# Patient Record
Sex: Female | Born: 1970 | Race: White | Hispanic: No | Marital: Married
Health system: Midwestern US, Community
[De-identification: ages and names within clinical notes are randomized; demographics above are authoritative.]

## PROBLEM LIST (undated history)

## (undated) DIAGNOSIS — M5010 Cervical disc disorder with radiculopathy, unspecified cervical region: Secondary | ICD-10-CM

## (undated) DIAGNOSIS — G894 Chronic pain syndrome: Secondary | ICD-10-CM

---

## 2017-02-23 ENCOUNTER — Ambulatory Visit: Admit: 2017-02-23 | Discharge: 2017-02-23 | Payer: PRIVATE HEALTH INSURANCE | Attending: Anesthesiology

## 2017-02-23 DIAGNOSIS — M5412 Radiculopathy, cervical region: Secondary | ICD-10-CM

## 2017-02-23 NOTE — ACP (Advance Care Planning) (Signed)
No ACP on file for patient. Patient not interested at this time.

## 2017-02-23 NOTE — Progress Notes (Signed)
[facility]  Interventional Pain Management Consultation History & Physical    PATIENT NAME:  Deborah Williams     DATE OF BIRTH:   Aug 11, 1971    DATE OF SERVICE:   02/23/2017      CHIEF COMPLAINT:  Neck Pain and Shoulder Pain      REASON FOR VISIT:   Marnita Poirier Brodhead presents to the pain clinic today for initial evaluation and to consider interventional pain management options as indicated for the type and location of the pain the patient is presenting with.            HISTORY OF PRESENT ILLNESS:     Patient presents for initial evaluation and consideration for interventional procedures as indicated.  She is referred to [place] by Dr. Shelbie Hutching for evaluation and consideration for cervical procedures.     Today's evaluation patient endorses tonic neck and upper extremity radiating symptoms of long-standing duration.  She denies any specific antecedent trauma injury or accident.  She endorses insidious onset to her neck and upper extremity symptoms.  She currently endorses bilateral neck pain radiating upper extremity pain numbness paresthesias pins and needles.  Aching sharp shooting in the neck and bilateral upper extremities.  Her upper extremities feel weak and heavy at times.  Symptoms are increased with turning her head to either side as well as looking up or down.  Symptoms are increased with use of her arms.  She is tried medications with no benefit.  Medications include gabapentin and clonidine.  She has had previous cervical spine surgery, C5- 6 spinal fusion..  She is not take blood thinners.     Review of available medical records, progress note dated February 10, 2017 by Dr. Shelbie Hutching is reviewed.  Recommendation for cervical injections as noted.  Chronic neck pain, bilateral upper extremity symptoms.  Patient is on gabapentin 300 mg twice daily.  Mobic daily clonidine daily Flector patches.  Aching sharp upper extremity radiating quality pain.  Bilateral  upper extremity weakness.  Bilateral neck pain is noted.  Cervical thoracic radiculopathy.  Cervical postlaminectomy syndrome.  Bilateral carpal tunnel syndrome.     Review of the MRI cervical shows multiple previous cervical MRIs.  Last 10/2010.  Fusion C5-6.  Degenerative disc disease with moderate stenosis C4-5 C6-7.  Electromyelogram studies are noted.  Patient's chronic neck and arm pain since 2010, neck fusion 2012.  Constant neck pain that radiates to the arms.  Neck and arm pain are equally bothersome.  Right arm more than left arm.  Patient stopped her gabapentin regimen because of side effects.  She has not had physical therapy or injections since 2014.     Updated cervical MRI January 09, 2017.  Mild disc bulge C4-5 mild to moderate central canal stenosis mild bilateral neuroforaminal narrowing.  Disc protrusion C6-7 causing effacement of ventral cord moderate canal stenosis.  C4-5 focal protrusion mild to moderate canal stenosis bilateral neuroforaminal narrowing.  C5-6 anterior cervical disc fusion.  Shallow protrusion C6-7.          ASSESSMENT/OPTIONS: as follows.    We discussed options.  Patient is presenting with long-standing history chronic neck pain, upper extremity radiating symptoms.  She has had previous cervical spine fusion, anterior cervical disc fusion C5-6.  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  She likely also has an element of cervical facet syndrome.  I have been asked by Dr. Clydene Pugh to consider especially right-sided cervical facet injections, right side C4-5, C5-6.  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  She requested to be unconscious for her procedure.  I told her I cannot facilitate this, however I can provide moderate sedation that will almost assuredly provide her with the sedation that she is requesting.  In light  of her needle phobia, as well as her signs and symptoms, imaging evidence suggestive of cervical radiculitis versus cervical radiculopathy in addition to cervical facet syndrome, I will start with cervical epidural steroid injections with IV conscious sedation.  I believe she will tolerate these better, it would be 1 interventional needlestick instead of 2-3 or 4 if we were to do both sides of cervical facets being requested.  If patient is still having symptomatology of her cervical facets, I will address these at subsequent interventional procedure visits.  I have discussed the risks and benefits, indications, contraindications, and side effects of intended procedure with the patient.  I have used skeleton spine model to describe and discuss the procedure with the patient.  I have answered all questions relating to the procedure.  Patient understands the nature of the procedure and wishes to proceed.  Patient has no further questions.               MRI Results (most recent):  No results found for this or any previous visit.        PAST MEDICAL HISTORY:   The patient  has no past medical history on file.    PAST SURGICAL HISTORY:   The patient  has no past surgical history on file.    CURRENT MEDICATIONS:   The patient has a current medication list which includes the following prescription(s): meloxicam, gabapentin, clonidine hcl, and estradiol.    ALLERGIES:     Allergies   Allergen Reactions   ??? Daypro [Oxaprozin] Hives       FAMILY HISTORY:   The patient family history is not on file.    SOCIAL HISTORY:   The patient  reports that she has quit smoking. She does not have any smokeless tobacco history on file. The patient  has no alcohol history on file. She      REVIEW OF SYSTEMS:    The patient denies fever, chills, weight loss (Constitutional), rash, itching (Skin), tinnitus, congestion (HENT), blurred vision, photophobia (Eyes), palpitations, orthopnea  (Cardiovascular), hemoptysis, wheezing (Respiratory), nausea, vomiting, diarrhea (Gastrointestinal), dysuria, hematuria, urgency (Genitourinary), bowel or bladder incontinence, loss of consciousness (Neurologic), suicidal or homicidal ideation or hallucinations (Psychiatric). Denies swelling, axillary or groin masses (Lymphatic).           PHYSICAL EXAM:  VS:   Visit Vitals   ??? BP 120/55 (BP 1 Location: Left arm, BP Patient Position: Sitting)   ??? Pulse 77   ??? Temp 98.2 ??F (36.8 ??C) (Oral)   ??? Resp 16   ??? Ht 5' 1.5" (1.562 m)   ??? Wt 86.2 kg (190 lb)   ??? LMP 02/18/2017   ??? BMI 35.32 kg/m2     General: Well-developed and well-nourished. Body habitus consistent with recorded height and weight and the calculated BMI. Apparent distress due to neck and upper extremity symptoms.   Head: Normocephalic, atraumatic.  Skin: Inspection of the skin reveals no rashes, lesions or infection.  CV: Regular rate. No murmurs or rubs noted. No peripheral edema noted.  Pulm: Respirations are even and unlabored.  Extr: No clubbing, cyanosis, or edema noted.  Musculoskeletal:  1. Cervical spine ???decreased range of motion all axes .  Paraspinous tenderness right greater than left paraspinal and facet areas.   There is no scoliosis, asymmetry, or musculoskeletal defect.  2. Thoracic spine ??? Full ROM.  No paraspinous tenderness at any level.  There is no scoliosis, asymmetry, or musculoskeletal defect.  3. Lumbar spine ???decreased ROM.  No paraspinous tenderness at any level.  SI joints are nontender bilaterally. There is no scoliosis, asymmetry, or musculoskeletal defect.  4. Right upper extremity ??? Full ROM.  5/5 muscle strength in all muscle groups. No pain or tenderness in shoulder, elbow, wrist, or hand.  5. Left upper extremity ??? Full ROM.  5/5 muscle strength in all muscle groups.  No pain or tenderness in shoulder, elbow, wrist, or hand.  6. Right lower extremity ??? Full ROM.  5/5 muscle strength in all muscle  groups. No pain, tenderness, or swelling in the hip, knee, ankle or foot.    7. Left lower extremity ??? Full ROM.  5/5 muscle strength in all muscle groups.  No pain, tenderness, or swelling in the hip, knee, ankle or foot.    Neurological:  1. Mental Status - Alert, awake and oriented. Speech is clear and appropriate.  2. Cranial Nerves - Extraocular muscles intact bilaterally. Cranial nerves II-XII grossly intact bilaterally.  3. Gait - Non-antalgic   4. Reflexes - 2+ and symmetric throughout.  5. Sensation - Intact to light touch and pin prick.   6. Provocative Tests - Spurling???s negative bilaterally. Straight leg raise negative bilaterally.   Psychological:  1. Mood and affect ??? Appropriate.  2. Speech ??? Appropriate.  3. Though content ??? Appropriate.  4. Judgment ??? Appropriate.    ASSESSMENT:      ICD-10-CM ICD-9-CM    1. Cervical radiculopathy M54.12 723.4    2. Cervical facet syndrome (HCC) M46.92 723.8    3. Degenerative disc disease, cervical M50.30 722.4    4. Chronic pain syndrome G89.4 338.4            PLAN:    1.    I have thoroughly discussed the risks and benefits, indications, contraindications, and side effects of any and procedures that were mentioned at today's patient visit. I have used a skeleton model to explain all procedures, as well as to provide added emphasis regarding procedures and as well for patient education purposes.       I have answered all questions in great detail, and I have obtained verbal confirmation for all procedures planned with the patient.   3.    I have reviewed in great detail today, when indicated, the patient's MRI and other imaging studies with the patient. I have explained to the patient, when indicated, their condition using both actual recent and relevant images insofar as I am able to obtain actual images. I have used a skeleton model for added emphasis as well as patient education.      4.    I have advised patient to have a primary care provider continue to  care for their health maintenance and general medical conditions.   5,    I have placed appropriate referrals to specialty care providers as I have deemed necessary through today's clinical consultation with the patient.  5.    I have explained to the patient that if any significant side effects, issues, problems, concerns, or perceived complications may arise at around the time of the  patient's procedures, they should either call the pain management clinic or go to the emergency room immediately for medical provider evaluation.   6.   I have encouraged all patients to call the pain management clinic with any questions or concerns that they may have pertaining to their procedures.     DISPOSITION:   The patient???s condition and plan were discussed at length and all questions were answered.  The patient agrees with the plan.    A total of 45 minutes was spent with the patient of which over half of the time was spent counseling the patient.     Allayne GitelmanJames H Bernadett Milian, MD 02/23/2017 9:23 PM    Note: Although these clinic notes were documented by the provider at the time of the exam, they have not been proofed and are subject to transcription variance.

## 2017-02-23 NOTE — Progress Notes (Signed)
The pt is here today to discuss procedure options with Dr. Henick. Pt rates pain as 4/10.

## 2017-02-23 NOTE — H&P (View-Only) (Signed)
The pt is here today to discuss procedure options with Dr. Henick. Pt rates pain as 4/10.

## 2017-03-15 NOTE — Telephone Encounter (Signed)
Called to stop ibuprofen 4 days  prior to procedure on 03/20/1917.

## 2017-03-20 ENCOUNTER — Inpatient Hospital Stay: Payer: PRIVATE HEALTH INSURANCE

## 2017-03-20 ENCOUNTER — Ambulatory Visit: Admit: 2017-03-20 | Payer: PRIVATE HEALTH INSURANCE

## 2017-03-20 LAB — HCG URINE, QL. - POC: Pregnancy test,urine (POC): NEGATIVE

## 2017-03-20 MED ORDER — DEXAMETHASONE SODIUM PHOSPHATE (PF) 10 MG/ML INJECTION
10 mg/mL | INTRAMUSCULAR | Status: AC
Start: 2017-03-20 — End: ?

## 2017-03-20 MED ORDER — IOPAMIDOL 41 % INTRATHECAL
200 mg iodine /mL (41 %) | INTRATHECAL | Status: DC | PRN
Start: 2017-03-20 — End: 2017-03-20
  Administered 2017-03-20: 18:00:00

## 2017-03-20 MED ORDER — MIDAZOLAM 1 MG/ML IJ SOLN
1 mg/mL | INTRAMUSCULAR | Status: AC
Start: 2017-03-20 — End: ?

## 2017-03-20 MED ORDER — SODIUM CHLORIDE 0.9 % IJ SYRG
INTRAMUSCULAR | Status: DC | PRN
Start: 2017-03-20 — End: 2017-03-20
  Administered 2017-03-20: 18:00:00 via INTRAVENOUS

## 2017-03-20 MED ORDER — LIDOCAINE (PF) 10 MG/ML (1 %) IJ SOLN
10 mg/mL (1 %) | INTRAMUSCULAR | Status: AC
Start: 2017-03-20 — End: ?

## 2017-03-20 MED ORDER — FENTANYL CITRATE (PF) 50 MCG/ML IJ SOLN
50 mcg/mL | INTRAMUSCULAR | Status: AC
Start: 2017-03-20 — End: ?

## 2017-03-20 MED ORDER — FENTANYL CITRATE (PF) 50 MCG/ML IJ SOLN
50 mcg/mL | INTRAMUSCULAR | Status: DC | PRN
Start: 2017-03-20 — End: 2017-03-20
  Administered 2017-03-20: 18:00:00 via INTRAVENOUS

## 2017-03-20 MED ORDER — DEXAMETHASONE SODIUM PHOSPHATE 10 MG/ML IJ SOLN
10 mg/mL | INTRAMUSCULAR | Status: DC | PRN
Start: 2017-03-20 — End: 2017-03-20
  Administered 2017-03-20: 18:00:00

## 2017-03-20 MED ORDER — MIDAZOLAM 1 MG/ML IJ SOLN
1 mg/mL | INTRAMUSCULAR | Status: DC | PRN
Start: 2017-03-20 — End: 2017-03-20
  Administered 2017-03-20: 18:00:00 via INTRAVENOUS

## 2017-03-20 MED ORDER — LIDOCAINE (PF) 10 MG/ML (1 %) IJ SOLN
10 mg/mL (1 %) | INTRAMUSCULAR | Status: DC | PRN
Start: 2017-03-20 — End: 2017-03-20
  Administered 2017-03-20: 18:00:00

## 2017-03-20 MED ORDER — IOPAMIDOL 41 % INTRATHECAL
200 mg iodine /mL (41 %) | INTRATHECAL | Status: AC
Start: 2017-03-20 — End: ?

## 2017-03-20 MED FILL — BD POSIFLUSH NORMAL SALINE 0.9 % INJECTION SYRINGE: INTRAMUSCULAR | Qty: 10

## 2017-03-20 MED FILL — LIDOCAINE (PF) 10 MG/ML (1 %) IJ SOLN: 10 mg/mL (1 %) | INTRAMUSCULAR | Qty: 30

## 2017-03-20 MED FILL — DEXAMETHASONE SODIUM PHOSPHATE (PF) 10 MG/ML INJECTION: 10 mg/mL | INTRAMUSCULAR | Qty: 2

## 2017-03-20 MED FILL — ISOVUE-M 200  41 % INTRATHECAL SOLUTION: 200 mg iodine /mL (41 %) | INTRATHECAL | Qty: 10

## 2017-03-20 MED FILL — MIDAZOLAM 1 MG/ML IJ SOLN: 1 mg/mL | INTRAMUSCULAR | Qty: 4

## 2017-03-20 MED FILL — FENTANYL CITRATE (PF) 50 MCG/ML IJ SOLN: 50 mcg/mL | INTRAMUSCULAR | Qty: 4

## 2017-03-20 NOTE — Procedures (Signed)
[facility]    INTERLAMINAR CERVICAL EPIDURAL STEROID INJECTION  PROCEDURE REPORT      PATIENT:  Deborah Williams  DATE OF BIRTH:  04-Oct-1971  DATE OF SERVICE:  03/20/2017  SITE:  [facility] Special [facility] Suite    PRE-PROCEDURE DIAGNOSIS:  See Above    POST-PROCEDURE DIAGNOSIS:  See Above                PROCEDURE:    1. Interlaminar cervical epidural steroid injection, C7-T1  (16109)  2. Fluoroscopic needle guidance (spinal) (60454)  3. Supervision of moderate sedation [phone])    ANESTHESIA:  Local with moderate IV sedation. See Medication Administration Record for specific medications and dosage.    COMPLICATIONS: None.    PHYSICIAN:  Seth Bake, MD    PRE-PROCEDURE NOTE:  Pre-procedural assessment of the patient was performed including a limited history and physical examination.  The details of the procedure were discussed with the patient, including the risks, benefits and alternative options and an informed consent was obtained. The patient???s NPO status, if necessary for the specific procedure and/or administration of moderate intravenous sedation, if utilized, and availability of a responsible adult to escort the patient following the procedure were confirmed.    A peripheral intravenous cannula was placed without difficulty and lactated Ringer???s solution administered. See nursing notes for details.    PROCEDURE NOTE:  The patient was brought to the procedure suite and positioned on the fluoroscopy table in the prone position. Physiologic monitors were applied and supplemental oxygen was administered via nasal cannula. The skin was prepped in the standard surgical fashion and sterile drapes were applied over the procedure site. Please refer to the Flowsheet for documentation of the patient???s vital signs and the Medication Administration Record for any oral and/or intravenous sedation administered prior to or during the  procedure.    The above-listed interlaminar space was identified and the skin and subcutaneous tissues were infiltrated with 1% Lidocaine.  Under anterior-posterior fluoroscopic guidance an 18g, 3.5-inch Tuohy epidural needle was advanced along the previously identified interlaminar space.  The fluoroscope was then turned lateral view and a loss of resistance syringe was attached to the needle containing preservative free normal saline. Under lateral flouroscopic guidance, the needle was then advanced through the ligamentum flavum, entering the epidural space with a clear and crisp loss of resistance. The needle was not advanced beyond the interlaminar line at any time during this process. No CSF was noted. Aspiration was negative for blood or CSF.  Additional confirmation was made with the injection of 0.5 mL of a nonionic water-soluble radiographic contrast medium (Isovue-M 200) demonstrating appropriate epidural spread and the absence of vascular uptake.       Following this, a 3 mL solution comprised of 2 mL of dexamethasone (10mg /ml) and 1 mL of lidocaine 1% preservative free was injected slowly after negative aspiration. The needle was cleared of steroid solution and removed. The area was thoroughly cleaned and sterile bandages applied as necessary. The patient tolerated the procedure well and vital signs remained stable throughout the procedure.    POST-PROCEDURE COURSE:  The patient was escorted from the procedure suite in satisfactory condition and recovered per facility protocol based on the type of procedure performed and/or the sedation utilized. The patient did not experience any adverse events and remained hemodynamically stable during the post-procedure period.    DISCHARGE NOTE:  Upon discharge, the patient was able to  tolerate fluids and was in no acute distress.  The patient was oriented to person, place and time and vital signs were stable. Appropriate post-procedure instructions were provided and  explained to the patient in detail and all questions were answered.    Seth BakeJames H Torell Minder, MD 03/20/2017 2:31 PM

## 2017-03-20 NOTE — Procedures (Signed)
[facility]    INTERLAMINAR CERVICAL EPIDURAL STEROID INJECTION  PROCEDURE REPORT      PATIENT:  Deborah Williams  DATE OF BIRTH:  [birth year]  DATE OF SERVICE:  03/20/2017  SITE:  [facility] Special [facility]    PRE-PROCEDURE DIAGNOSIS:  See Above    POST-PROCEDURE DIAGNOSIS:  See Above                PROCEDURE:    1. Interlaminar cervical epidural steroid injection, C7-T1  (16109)  2. Fluoroscopic needle guidance (spinal) (60454)  3. Supervision of moderate sedation [phone])    ANESTHESIA:  Local with moderate IV sedation. See Medication Administration Record for specific medications and dosage.    COMPLICATIONS: None.    PHYSICIAN:  Seth Bake, MD    PRE-PROCEDURE NOTE:  Pre-procedural assessment of the patient was performed including a limited history and physical examination.  The details of the procedure were discussed with the patient, including the risks, benefits and alternative options and an informed consent was obtained. The patient???s NPO status, if necessary for the specific procedure and/or administration of moderate intravenous sedation, if utilized, and availability of a responsible adult to escort the patient following the procedure were confirmed.    A peripheral intravenous cannula was placed without difficulty and lactated Ringer???s solution administered. See nursing notes for details.    PROCEDURE NOTE:  The patient was brought to the procedure suite and positioned on the fluoroscopy table in the prone position. Physiologic monitors were applied and supplemental oxygen was administered via nasal cannula. The skin was prepped in the standard surgical fashion and sterile drapes were applied over the procedure site. Please refer to the Flowsheet for documentation of the patient???s vital signs and the Medication Administration Record for any oral and/or intravenous sedation  administered prior to or during the procedure.    The above-listed interlaminar space was identified and the skin and subcutaneous tissues were infiltrated with 1% Lidocaine.  Under anterior-posterior fluoroscopic guidance an 18g, 3.5-inch Tuohy epidural needle was advanced along the previously identified interlaminar space.  The fluoroscope was then turned lateral view and a loss of resistance syringe was attached to the needle containing preservative free normal saline. Under lateral flouroscopic guidance, the needle was then advanced through the ligamentum flavum, entering the epidural space with a clear and crisp loss of resistance. The needle was not advanced beyond the interlaminar line at any time during this process. No CSF was noted. Aspiration was negative for blood or CSF.  Additional confirmation was made with the injection of 0.5 mL of a nonionic water-soluble radiographic contrast medium (Isovue-M 200) demonstrating appropriate epidural spread and the absence of vascular uptake.       Following this, a 3 mL solution comprised of 2 mL of dexamethasone (10mg /ml) and 1 mL of lidocaine 1% preservative free was injected slowly after negative aspiration. The needle was cleared of steroid solution and removed. The area was thoroughly cleaned and sterile bandages applied as necessary. The patient tolerated the procedure well and vital signs remained stable throughout the procedure.    POST-PROCEDURE COURSE:  The patient was escorted from the procedure suite in satisfactory condition and recovered per facility protocol based on the type of procedure performed and/or the sedation utilized. The patient did not experience any adverse events and remained hemodynamically stable during the post-procedure period.    DISCHARGE NOTE:  Upon discharge, the patient was able to  tolerate fluids and was in no acute distress.  The patient was oriented to person, place  and time and vital signs were stable. Appropriate post-procedure instructions were provided and explained to the patient in detail and all questions were answered.    Seth BakeJames H Oziah Vitanza, MD 03/20/2017 2:31 PM

## 2017-03-20 NOTE — Other (Signed)
Patient armband removed and shredded. Patient ambulated off unit without any signs of distress.

## 2017-03-20 NOTE — Interval H&P Note (Signed)
H&P Update:  Deborah EllisBeth B Williams was seen and examined.  History and physical has been reviewed. The patient has been examined. There have been no significant clinical changes since the completion of the originally dated History and Physical.    [facility]  Brief Pre-Procedure History & Physical    PATIENT NAME:  Deborah Williams   DATE OF BIRTH:  09-22-1971  DATE OF SERVICE:  03/20/2017      CHIEF COMPLAINT:  Pain    HISTORY OF PRESENT ILLNESS:  Deborah Williams presents today for a previously diagnosed problem contributing to some or all of this patient???s pain. The location and pattern of the pain has not changed substantially since the last visit in our office. No other significant medical changes have occurred in the last 30 days.    PAST MEDICAL HISTORY:  The patient  has no past medical history on file.    PAST SURGICAL HISTORY:  The patient  has a past surgical history that includes hx tonsillectomy; hx dilation and curettage; hx cholecystectomy; and hx cervical fusion.    CURRENT MEDICATIONS:  See Medication Administration Record [facility](MAR) in the patient's electronic record.    ALLERGIES:    Allergies   Allergen Reactions   ??? Daypro [Oxaprozin] Hives       FAMILY HISTORY:  The patient family history is not on file.    SOCIAL HISTORY:  The patient  reports that she has quit smoking. She does not have any smokeless tobacco history on file. The patient  reports that she does not drink alcohol. She  reports that she does not use illicit drugs.     REVIEW OF SYSTEMS:  Deborah Williams denies any fever, chills, unexplained weight loss, use of antibiotics for recent infection or bleeding abnormalities.    PHYSICAL EXAM:  VS:   Visit Vitals   ??? BP 127/82 (BP 1 Location: Left arm, BP Patient Position: Sitting)   ??? Pulse 78   ??? Temp 98.4 ??F (36.9 ??C)   ??? Resp 16   ??? Ht 5' 1.5" (1.562 m)   ??? Wt 86.2 kg (190 lb)   ??? LMP 02/18/2017   ??? SpO2 98%   ??? BMI 35.32 kg/m2      Gen: Well-developed. Body habitus consistent with recorded height and weight and the calculated BMI. No apparent distress.  Head: Normocephalic, atraumatic.  Skin: No obvious rashes, lesions or infection.  Pulm: Respirations are even and unlabored.   Psych:    Mood, affect and speech ??? Appropriate.    ASSESSMENT:   1. Stable for cervical ESI interventional pain procedure as discussed.      PLAN:  Proceed with scheduled procedure.     Seth BakeJames H Janera Peugh, MD 03/20/2017 2:07 PM        Signed By: Seth BakeJames H Curtiss Mahmood, MD     March 20, 2017 2:07 PM

## 2017-03-20 NOTE — Addendum Note (Signed)
Addended by: Madolin Twaddle H on: 03/20/2017 10:54 AM      Modules accepted: Orders

## 2017-03-27 NOTE — Telephone Encounter (Signed)
Ms. Deborah Williams was contacted for follow-up status post Interlaminar cervical epidural steroid injection, C7-T1 on March 20, 2017. She reports:    Pre-procedure numerical pain score: 7/10  Post-procedure numerical pain score immediately after: 5/10  Duration of relief post-procedure (if applicable): 1 weeks  Improvement in functional activities (if applicable): Yes  Percentage of overall improvement: 30%    COMMENTS:

## 2017-03-29 NOTE — Telephone Encounter (Signed)
Called [phone] left message to stop ibuprofen 4 days prior to procedure on 04/03/17.

## 2017-03-30 ENCOUNTER — Ambulatory Visit: Admit: 2017-03-30 | Discharge: 2017-03-30 | Payer: PRIVATE HEALTH INSURANCE | Attending: Anesthesiology

## 2017-03-30 DIAGNOSIS — M5412 Radiculopathy, cervical region: Secondary | ICD-10-CM

## 2017-03-30 NOTE — H&P (View-Only) (Signed)
The pt is here today to discuss procedure options with Dr. Henick. Pt rates pain as 6/10.

## 2017-03-30 NOTE — Addendum Note (Signed)
Addended by: Seth BakeHENICK, Suleiman Finigan H on: 03/30/2017 10:14 PM      Modules accepted: Orders

## 2017-03-30 NOTE — Progress Notes (Signed)
The pt is here today to discuss procedure options with Dr. Henick. Pt rates pain as 6/10.

## 2017-03-30 NOTE — Progress Notes (Signed)
[facility]  Interventional Pain Management Consultation History & Physical    PATIENT NAME:  Deborah Williams     DATE OF BIRTH:   12-03-1970    DATE OF SERVICE:   03/30/2017      CHIEF COMPLAINT:  Neck Pain (right side down to shoulder)      REASON FOR VISIT:   Deborah Williams presents to the pain clinic today for follow on evaluation and to consider interventional pain management options as indicated for the type and location of the pain the patient is presenting with.            HISTORY OF PRESENT ILLNESS:    This is a reevaluation and consideration for further interventional procedures as indicated for     I initially saw this very pleasant lady Feb 23, 2017.  This was at the request of Dr. Shelbie Hutching for evaluation of cervical procedures.  I found patient to have signs and symptoms, as well as exam and imaging evidence suggestive of cervical radiculitis versus cervical radiculopathy secondary to cervical degenerative changes.  She has undergone one cervical epidural steroid injection with IV conscious sedation so far.  This is done on March 20, 2017.  She has had some benefit from her neck and upper extremity, bilateral, radiating symptoms.  She wishes to have another procedure     We reviewed her cervical MRI from several weeks ago.  This is significant for anterior cervical changes including anterior cervical disc fusion which appears to me to be C6-7 level.  She appears to have anterior disc protrusion at the C7-T1 level, indenting the ventral aspect of the cord.  I have shown this to her on the image disc that she is brought in.      ASSESSMENT/OPTIONS: as follows.     We discussed options.  Patient continues to have cervical radiculitis versus cervical radiculopathy secondary to cervical degenerative changes.  We can pursue 1 or 2 more cervical epidural steroid injections with IV conscious sedation.  She is  still having pain, I would likely refer her back to neurosurgeon or orthopedic spine surgeon for reevaluation.  If she is deemed not to be a surgical candidate for reoperation, I would discuss with the patient possibility of spinal cord stimulator trial placement if she would like to pursue this.  She may also get some benefit from cervical radiofrequency neurotomy procedures, likely below the level of her previous fusion, which again appears to me to be C6-7 level.               MRI Results (most recent):  No results found for this or any previous visit.        PAST MEDICAL HISTORY:   The patient  has no past medical history on file.    PAST SURGICAL HISTORY:   The patient  has a past surgical history that includes hx tonsillectomy; hx dilation and curettage; hx cholecystectomy; and hx cervical fusion.    CURRENT MEDICATIONS:   The patient has a current medication list which includes the following prescription(s): meloxicam, gabapentin, clonidine hcl, and estradiol.    ALLERGIES:     Allergies   Allergen Reactions   ??? Daypro [Oxaprozin] Hives       FAMILY HISTORY:   The patient family history is not on file.    SOCIAL HISTORY:   The patient  reports that she has quit smoking. She does not have any smokeless tobacco history  on file. The patient  reports that she does not drink alcohol. She      REVIEW OF SYSTEMS:    The patient denies fever, chills, weight loss (Constitutional), rash, itching (Skin), tinnitus, congestion (HENT), blurred vision, photophobia (Eyes), palpitations, orthopnea (Cardiovascular), hemoptysis, wheezing (Respiratory), nausea, vomiting, diarrhea (Gastrointestinal), dysuria, hematuria, urgency (Genitourinary), bowel or bladder incontinence, loss of consciousness (Neurologic), suicidal or homicidal ideation or hallucinations (Psychiatric). Denies swelling, axillary or groin masses (Lymphatic).           PHYSICAL EXAM:  VS:   Visit Vitals    ??? BP 111/51 (BP 1 Location: Left arm, BP Patient Position: Sitting)   ??? Pulse 76   ??? Temp 98.3 ??F (36.8 ??C) (Oral)   ??? Resp 14   ??? Ht 5' 1.5" (1.562 m)   ??? Wt 86.2 kg (190 lb)   ??? LMP 03/19/2017   ??? BMI 35.32 kg/m2     General: Well-developed and well-nourished. Body habitus consistent with recorded height and weight and the calculated BMI. Apparent distress due to neck and upper extremity symptoms.   Head: Normocephalic, atraumatic.  Skin: Inspection of the skin reveals no rashes, lesions or infection.  CV: Regular rate. No murmurs or rubs noted. No peripheral edema noted.  Pulm: Respirations are even and unlabored.  Extr: No clubbing, cyanosis, or edema noted.  Musculoskeletal:  1. Cervical spine ???decreased range of motion bilateral .  Paraspinous tenderness.    There is no scoliosis, asymmetry, or musculoskeletal defect.  2. Thoracic spine ??? Full ROM.  No paraspinous tenderness at any level.  There is no scoliosis, asymmetry, or musculoskeletal defect.  3. Lumbar spine ??? Full ROM.  No paraspinous tenderness at any level.  SI joints are nontender bilaterally. There is no scoliosis, asymmetry, or musculoskeletal defect.  4. Right upper extremity ??? Full ROM.  5/5 muscle strength in all muscle groups. No pain or tenderness in shoulder, elbow, wrist, or hand.  5. Left upper extremity ??? Full ROM.  5/5 muscle strength in all muscle groups.  No pain or tenderness in shoulder, elbow, wrist, or hand.  6. Right lower extremity ??? Full ROM.  5/5 muscle strength in all muscle groups. No pain, tenderness, or swelling in the hip, knee, ankle or foot.    7. Left lower extremity ??? Full ROM.  5/5 muscle strength in all muscle groups.  No pain, tenderness, or swelling in the hip, knee, ankle or foot.    Neurological:  1. Mental Status - Alert, awake and oriented. Speech is clear and appropriate.  2. Cranial Nerves - Extraocular muscles intact bilaterally. Cranial nerves II-XII grossly intact bilaterally.  3. Gait - Non-antalgic    4. Reflexes - 2+ and symmetric throughout.  5. Sensation - Intact to light touch and pin prick.   6. Provocative Tests - Spurling???s negative bilaterally. Straight leg raise negative bilaterally.   Psychological:  1. Mood and affect ??? Appropriate.  2. Speech ??? Appropriate.  3. Though content ??? Appropriate.  4. Judgment ??? Appropriate.    ASSESSMENT:      ICD-10-CM ICD-9-CM    1. Cervical radiculopathy M54.12 723.4    2. Degenerative disc disease, cervical M50.30 722.4    3. Cervical facet syndrome (HCC) M46.92 723.8    4. Chronic pain syndrome G89.4 338.4            PLAN:    1.    I have thoroughly discussed the risks and benefits, indications, contraindications, and side effects of  any/all procedures that were mentioned at today's patient visit. I have used a skeleton spine model when indicated to explain all procedures, as well as to provide added emphasis regarding procedures and as well for patient education purposes.       I have answered all questions in great detail, and I have obtained verbal and written confirmation for all procedures planned with the patient.   3.    I have reviewed in great detail today, when indicated, the patient's MRI and other imaging studies with the patient. I have explained to the patient, when indicated, their condition using both actual recent and relevant images insofar as I am able to obtain these images. I have used a skeleton spine model for added emphasis as well as for patient education.      4.    I have advised patient to have a primary care provider continue to care for their health maintenance and general medical conditions.   5,    I have placed appropriate referrals to specialty care providers as I have deemed necessary through today's clinical consultation with the patient.  5.    I have explained to the patient that if any significant side effects, issues, problems, concerns, or perceived complications as may  arise at around the time of the patient's procedures, they should either call the pain management clinic or go to the emergency room immediately for medical provider evaluation.   6.   I have encouraged all patients to call the pain management clinic with any questions or concerns that they may have pertaining to their procedures.     DISPOSITION:   The patient???s condition and plan were discussed at length and all questions were answered.  The patient agrees with the plan.    A total of 10 minutes was spent with the patient of which over half of the time was spent counseling the patient.     Seth Bake, MD 03/30/2017 1:45 PM    Note: Although these clinic notes were documented by the provider at the time of the exam, they have not been proofed and are subject to transcription variance.

## 2017-04-03 ENCOUNTER — Ambulatory Visit: Admit: 2017-04-03 | Payer: PRIVATE HEALTH INSURANCE

## 2017-04-03 ENCOUNTER — Inpatient Hospital Stay: Payer: PRIVATE HEALTH INSURANCE

## 2017-04-03 LAB — HCG URINE, QL. - POC: Pregnancy test,urine (POC): NEGATIVE

## 2017-04-03 MED ORDER — DEXAMETHASONE SODIUM PHOSPHATE 10 MG/ML IJ SOLN
10 mg/mL | INTRAMUSCULAR | Status: DC | PRN
Start: 2017-04-03 — End: 2017-04-03
  Administered 2017-04-03: 18:00:00

## 2017-04-03 MED ORDER — LIDOCAINE (PF) 10 MG/ML (1 %) IJ SOLN
10 mg/mL (1 %) | INTRAMUSCULAR | Status: DC | PRN
Start: 2017-04-03 — End: 2017-04-03
  Administered 2017-04-03: 18:00:00

## 2017-04-03 MED ORDER — IOPAMIDOL 41 % INTRATHECAL
200 mg iodine /mL (41 %) | INTRATHECAL | Status: DC | PRN
Start: 2017-04-03 — End: 2017-04-03
  Administered 2017-04-03: 18:00:00

## 2017-04-03 MED ORDER — MIDAZOLAM 1 MG/ML IJ SOLN
1 mg/mL | INTRAMUSCULAR | Status: DC | PRN
Start: 2017-04-03 — End: 2017-04-03
  Administered 2017-04-03 (×2): via INTRAVENOUS

## 2017-04-03 MED ORDER — FENTANYL CITRATE (PF) 50 MCG/ML IJ SOLN
50 mcg/mL | INTRAMUSCULAR | Status: AC
Start: 2017-04-03 — End: ?

## 2017-04-03 MED ORDER — FENTANYL CITRATE (PF) 50 MCG/ML IJ SOLN
50 mcg/mL | INTRAMUSCULAR | Status: DC | PRN
Start: 2017-04-03 — End: 2017-04-03
  Administered 2017-04-03 (×2): via INTRAVENOUS

## 2017-04-03 MED ORDER — MIDAZOLAM 1 MG/ML IJ SOLN
1 mg/mL | INTRAMUSCULAR | Status: AC
Start: 2017-04-03 — End: ?

## 2017-04-03 MED ORDER — SODIUM CHLORIDE 0.9 % IJ SYRG
INTRAMUSCULAR | Status: DC | PRN
Start: 2017-04-03 — End: 2017-04-03
  Administered 2017-04-03: 18:00:00 via INTRAVENOUS

## 2017-04-03 MED ORDER — DEXAMETHASONE SODIUM PHOSPHATE (PF) 10 MG/ML INJECTION
10 mg/mL | INTRAMUSCULAR | Status: AC
Start: 2017-04-03 — End: ?

## 2017-04-03 MED ORDER — IOPAMIDOL 41 % INTRATHECAL
200 mg iodine /mL (41 %) | INTRATHECAL | Status: AC
Start: 2017-04-03 — End: ?

## 2017-04-03 MED ORDER — LIDOCAINE (PF) 10 MG/ML (1 %) IJ SOLN
10 mg/mL (1 %) | INTRAMUSCULAR | Status: AC
Start: 2017-04-03 — End: ?

## 2017-04-03 MED FILL — LIDOCAINE (PF) 10 MG/ML (1 %) IJ SOLN: 10 mg/mL (1 %) | INTRAMUSCULAR | Qty: 30

## 2017-04-03 MED FILL — MIDAZOLAM 1 MG/ML IJ SOLN: 1 mg/mL | INTRAMUSCULAR | Qty: 2

## 2017-04-03 MED FILL — FENTANYL CITRATE (PF) 50 MCG/ML IJ SOLN: 50 mcg/mL | INTRAMUSCULAR | Qty: 2

## 2017-04-03 MED FILL — BD POSIFLUSH NORMAL SALINE 0.9 % INJECTION SYRINGE: INTRAMUSCULAR | Qty: 10

## 2017-04-03 MED FILL — DEXAMETHASONE SODIUM PHOSPHATE (PF) 10 MG/ML INJECTION: 10 mg/mL | INTRAMUSCULAR | Qty: 2

## 2017-04-03 MED FILL — ISOVUE-M 200  41 % INTRATHECAL SOLUTION: 200 mg iodine /mL (41 %) | INTRATHECAL | Qty: 10

## 2017-04-03 NOTE — Procedures (Signed)
[facility]    INTERLAMINAR CERVICAL EPIDURAL STEROID INJECTION  PROCEDURE REPORT      PATIENT:  Deborah Williams  DATE OF BIRTH:  [birth year]  DATE OF SERVICE:  04/03/2017  SITE:  [facility] Special Procedures Suite    PRE-PROCEDURE DIAGNOSIS:  See Above    POST-PROCEDURE DIAGNOSIS:  See Above                PROCEDURE:    1. Interlaminar cervical epidural steroid injection, C7-T1  (95621)  2. Fluoroscopic needle guidance (spinal) (30865)  3. Supervision of moderate sedation [phone])    ANESTHESIA:   Local with moderate IV sedation. See Medication Administration Record for specific medications and dosage.    COMPLICATIONS: None.    PHYSICIAN:  Seth Bake, MD    PRE-PROCEDURE NOTE:  Pre-procedural assessment of the patient was performed including a limited history and physical examination.  The details of the procedure were discussed with the patient, including the risks, benefits and alternative options and an informed consent was obtained. The patient???s NPO status, if necessary for the specific procedure and/or administration of moderate intravenous sedation, if utilized, and availability of a responsible adult to escort the patient following the procedure were confirmed.    A peripheral intravenous cannula was placed without difficulty and lactated Ringer???s solution administered. See nursing notes for details.    PROCEDURE NOTE:  The patient was brought to the procedure suite and positioned on the fluoroscopy table in the prone position. Physiologic monitors were applied and supplemental oxygen was administered via nasal cannula. The skin was prepped in the standard surgical fashion and sterile drapes were applied over the procedure site. Please refer to the Flowsheet for documentation of the patient???s vital signs and the Medication Administration Record for any oral and/or intravenous sedation administered prior to or during the  procedure.    The above-listed interlaminar space was identified and the skin and subcutaneous tissues were infiltrated with 1% Lidocaine.  Under anterior-posterior fluoroscopic guidance an 18g, 3.5-inch Tuohy epidural needle was advanced along the previously identified interlaminar space.  The fluoroscope was then turned lateral view and a loss of resistance syringe was attached to the needle containing preservative free normal saline. Under lateral flouroscopic guidance, the needle was then advanced through the ligamentum flavum, entering the epidural space with a clear and crisp loss of resistance. The needle was not advanced beyond the interlaminar line at any time during this process. No CSF was noted. Aspiration was negative for blood or CSF.  Additional confirmation was made with the injection of 0.5 mL of a nonionic water-soluble radiographic contrast medium (Isovue-M 200) demonstrating appropriate epidural spread and the absence of vascular uptake.       Following this, a 3 mL solution comprised of 2 mL of dexamethasone (10mg /ml) and 1 mL of lidocaine 1% preservative free was injected slowly after negative aspiration. The needle was cleared of steroid solution and removed. The area was thoroughly cleaned and sterile bandages applied as necessary. The patient tolerated the procedure well and vital signs remained stable throughout the procedure.    POST-PROCEDURE COURSE:  The patient was escorted from the procedure suite in satisfactory condition and recovered per facility protocol based on the type of procedure performed and/or the sedation utilized. The patient did not experience any adverse events and remained hemodynamically stable during the post-procedure period.    DISCHARGE NOTE:  Upon discharge, the patient was able  to tolerate fluids and was in no acute distress.  The patient was oriented to person, place and time and vital signs were stable. Appropriate post-procedure instructions were provided and  explained to the patient in detail and all questions were answered.    Seth BakeJames H Panhia Karl, MD 04/03/2017 4:11 PM

## 2017-04-03 NOTE — Procedures (Signed)
[facility] [place] [facility]    INTERLAMINAR CERVICAL EPIDURAL STEROID INJECTION  PROCEDURE REPORT      PATIENT:  Deborah Williams  DATE OF BIRTH:  1970-12-21  DATE OF SERVICE:  04/03/2017  SITE[facility]  [facility] Special Procedures Suite    PRE-PROCEDURE DIAGNOSIS:  See Above    POST-PROCEDURE DIAGNOSIS:  See Above                PROCEDURE:    1. Interlaminar cervical epidural steroid injection, C7-T1  ([id number]([id number])  2. Fluoroscopic needle guidance (spinal) ([id number])  3. Supervision of moderate sedation [phone]([id number])    ANESTHESIA:   Local with moderate IV sedation. See Medication Administration Record for specific medications and dosage.    COMPLICATIONS: None.    PHYSICIAN:  Seth BakeJames H Lenita Peregrina, MD    PRE-PROCEDURE NOTE:  Pre-procedural assessment of the patient was performed including a limited history and physical examination.  The details of the procedure were discussed with the patient, including the risks, benefits and alternative options and an informed consent was obtained. The patient???s NPO status, if necessary for the specific procedure and/or administration of moderate intravenous sedation, if utilized, and availability of a responsible adult to escort the patient following the procedure were confirmed.    A peripheral intravenous cannula was placed without difficulty and lactated Ringer???s solution administered. See nursing notes for details.    PROCEDURE NOTE:  The patient was brought to the procedure suite and positioned on the fluoroscopy table in the prone position. Physiologic monitors were applied and supplemental oxygen was administered via nasal cannula. The skin was prepped in the standard surgical fashion and sterile drapes were applied over the procedure site. Please refer to the Flowsheet for documentation of the patient???s vital signs and the Medication Administration Record for any oral and/or intravenous sedation  administered prior to or during the procedure.    The above-listed interlaminar space was identified and the skin and subcutaneous tissues were infiltrated with 1% Lidocaine.  Under anterior-posterior fluoroscopic guidance an 18g, 3.5-inch Tuohy epidural needle was advanced along the previously identified interlaminar space.  The fluoroscope was then turned lateral view and a loss of resistance syringe was attached to the needle containing preservative free normal saline. Under lateral flouroscopic guidance, the needle was then advanced through the ligamentum flavum, entering the epidural space with a clear and crisp loss of resistance. The needle was not advanced beyond the interlaminar line at any time during this process. No CSF was noted. Aspiration was negative for blood or CSF.  Additional confirmation was made with the injection of 0.5 mL of a nonionic water-soluble radiographic contrast medium (Isovue-M 200) demonstrating appropriate epidural spread and the absence of vascular uptake.       Following this, a 3 mL solution comprised of 2 mL of dexamethasone (10mg /ml) and 1 mL of lidocaine 1% preservative free was injected slowly after negative aspiration. The needle was cleared of steroid solution and removed. The area was thoroughly cleaned and sterile bandages applied as necessary. The patient tolerated the procedure well and vital signs remained stable throughout the procedure.    POST-PROCEDURE COURSE:  The patient was escorted from the procedure suite in satisfactory condition and recovered per facility protocol based on the type of procedure performed and/or the sedation utilized. The patient did not experience any adverse events and remained hemodynamically stable during the post-procedure period.    DISCHARGE NOTE:  Upon discharge, the patient was able  to tolerate fluids and was in no acute distress.  The patient was oriented to person, place  and time and vital signs were stable. Appropriate post-procedure instructions were provided and explained to the patient in detail and all questions were answered.    Seth Bake, MD 04/03/2017 4:11 PM

## 2017-04-03 NOTE — Other (Signed)
Patient armband removed and shredded. Patient ambulated for discharge. No complications observed.

## 2017-04-03 NOTE — Interval H&P Note (Signed)
H&P Update:  Deborah Williams was seen and examined.  History and physical has been reviewed. The patient has been examined. There have been no significant clinical changes since the completion of the originally dated History and Physical.    [facility]  Brief Pre-Procedure History & Physical    PATIENT NAME:  Deborah Williams   DATE OF BIRTH:  May 20, 1971  DATE OF SERVICE:  04/03/2017      CHIEF COMPLAINT:  Pain    HISTORY OF PRESENT ILLNESS:  Deborah Williams presents today for a previously diagnosed problem contributing to some or all of this patient???s pain. The location and pattern of the pain has not changed substantially since the last visit in our office. No other significant medical changes have occurred in the last 30 days.    PAST MEDICAL HISTORY:  The patient  has no past medical history on file.    PAST SURGICAL HISTORY:  The patient  has a past surgical history that includes hx tonsillectomy; hx dilation and curettage; hx cholecystectomy; and hx cervical fusion.    CURRENT MEDICATIONS:  See Medication Administration Record [facility](MAR) in the patient's electronic record.    ALLERGIES:    Allergies   Allergen Reactions   ??? Daypro [Oxaprozin] Hives       FAMILY HISTORY:  The patient family history is not on file.    SOCIAL HISTORY:  The patient  reports that she has quit smoking. She has never used smokeless tobacco. The patient  reports that she does not drink alcohol. She  reports that she does not use illicit drugs.     REVIEW OF SYSTEMS:  Deborah Williams denies any fever, chills, unexplained weight loss, use of antibiotics for recent infection or bleeding abnormalities.    PHYSICAL EXAM:  VS:   Visit Vitals   ??? BP 132/82 (BP 1 Location: Left arm, BP Patient Position: Sitting)   ??? Pulse 78   ??? Temp 97.7 ??F (36.5 ??C)   ??? Resp 18   ??? Ht 5' 1.5" (1.562 m)   ??? Wt 86.2 kg (190 lb)   ??? LMP 03/19/2017   ??? SpO2 97%   ??? BMI 35.32 kg/m2      Gen: Well-developed. Body habitus consistent with recorded height and weight and the calculated BMI. No apparent distress.  Head: Normocephalic, atraumatic.  Skin: No obvious rashes, lesions or infection.  Pulm: Respirations are even and unlabored.   Psych:    Mood, affect and speech ??? Appropriate.    ASSESSMENT:   1. Stable for cervical ESI interventional pain procedure as discussed.      PLAN:  Proceed with scheduled procedure.     Seth BakeJames H Cannen Dupras, MD 04/03/2017 1:39 PM        Signed By: Seth BakeJames H Laurieann Friddle, MD     April 03, 2017 1:39 PM

## 2017-04-10 NOTE — Telephone Encounter (Signed)
Ms. Roselle LocusBehrenhauser was contacted for follow-up status post Interlaminar cervical epidural steroid injection, C7-T1  on April 03, 2017. She reports:    Pre-procedure numerical pain score: 6/10  Post-procedure numerical pain score immediately after: 4/10  Duration of relief post-procedure (if applicable): 1 weeks  Improvement in functional activities (if applicable): Yes  Percentage of overall improvement: 25%    COMMENTS:

## 2017-05-11 ENCOUNTER — Ambulatory Visit: Admit: 2017-05-11 | Discharge: 2017-05-11 | Payer: PRIVATE HEALTH INSURANCE | Attending: Anesthesiology

## 2017-05-11 DIAGNOSIS — M5412 Radiculopathy, cervical region: Secondary | ICD-10-CM

## 2017-05-11 NOTE — Progress Notes (Signed)
[facility]  Interventional Pain Management Consultation History & Physical    PATIENT NAME:  Deborah Williams     DATE OF BIRTH:   12/20/1970    DATE OF SERVICE:   05/11/2017      CHIEF COMPLAINT:  Neck Pain (right into shoulder) and Head Pain      REASON FOR VISIT:   Deborah Williams presents to the pain clinic today for  follow on evaluation and to consider interventional pain management options as indicated for the type and location of the pain the patient is presenting with.            HISTORY OF PRESENT ILLNESS:     Patient returns for follow on evaluation and to discuss further procedures as may be necessary.  I am also asked to complete an updated history and physical exam within 30 days of anticipated upcoming procedure.     At today's evaluation, patient has a recurrent of her neck pain, and as well bilateral upper extremity radiating pain and numbness tingling.  She is known very well to our pain clinic.  I initially saw her Feb 23, 2017.  I found her to have signs and symptoms, as well as exam and imaging evidence suggestive of cervical radiculitis secondary to cervical degenerative disc disease, neuroforaminal narrowing.  She has undergone 2 cervical epidural steroid injections with IV conscious sedation.  These have helped her back and radiating upper extremity symptoms significantly.  She has had a recurrence of her symptoms.  He endorses pain of her neck and both arms.  She has numbness and tingling down both arms.  She has difficulty raising her arms above her shoulders.  She has difficulty working above her head.  She has difficulty lifting heavier objects.  Difficulty with repetitive motion including vacuuming, sweeping, scrubbing.  Unfortunately, her job requires her to do these activities.  This exacerbates her neck and upper extremity symptoms.      We reviewed her cervical imaging again.  Cervical MRI dated January 09, 2017 shows disc bulges C4-5, moderate canal stenosis, mild bilateral neuroforaminal narrowing C4-5.  Disc protrusion C6-7.  Neuroforaminal narrowing.  Anterior cervical disc fusion C5-6.      ASSESSMENT/OPTIONS: as follows.  We discussed options.  We will plan repeating cervical epidural steroid injection at C7-T1 level with IV conscious sedation.  This has helped her quite significantly in the past, I believe it will continue to do so.  Patient can also continue her other pain management medications provided by other providers.  Perhaps another course of physical therapy may help patient.  We will see after this injection.  I have discussed the risks and benefits, indications, contraindications, and side effects of intended procedure with the patient.  I have used skeleton spine model to describe and discuss the procedure with the patient.  I have answered all questions relating to the procedure.  Patient understands the nature of the procedure and wishes to proceed.  Patient has no further questions.               MRI Results (most recent):  No results found for this or any previous visit.        PAST MEDICAL HISTORY:   The patient  has no past medical history on file.    PAST SURGICAL HISTORY:   The patient  has a past surgical history that includes hx tonsillectomy; hx dilation and curettage; hx cholecystectomy; and hx cervical   fusion.    CURRENT MEDICATIONS:   The patient has a current medication list which includes the following prescription(s): pregabalin, tizanidine, celecoxib, gabapentin, clonidine hcl, estradiol, and meloxicam.    ALLERGIES:     Allergies   Allergen Reactions   ??? Daypro [Oxaprozin] Hives       FAMILY HISTORY:   The patient family history is not on file.    SOCIAL HISTORY:   The patient  reports that she has quit smoking. She has never used smokeless tobacco. The patient  reports that she does not drink alcohol. She      REVIEW OF SYSTEMS:    The patient denies fever, chills, weight loss  (Constitutional), rash, itching (Skin), tinnitus, congestion (HENT), blurred vision, photophobia (Eyes), palpitations, orthopnea (Cardiovascular), hemoptysis, wheezing (Respiratory), nausea, vomiting, diarrhea (Gastrointestinal), dysuria, hematuria, urgency (Genitourinary), bowel or bladder incontinence, loss of consciousness (Neurologic), suicidal or homicidal ideation or hallucinations (Psychiatric). Denies swelling, axillary or groin masses (Lymphatic).           PHYSICAL EXAM:  VS:   Visit Vitals   ??? BP 132/60 (BP 1 Location: Left arm, BP Patient Position: Sitting)   ??? Pulse 81   ??? Temp 97.3 ??F (36.3 ??C) (Oral)   ??? Resp 14   ??? Ht 5' 1.5" (1.562 m)   ??? Wt 90.7 kg (200 lb)   ??? LMP 04/11/2017   ??? BMI 37.18 kg/m2     General: Well-developed and well-nourished. Body habitus consistent with recorded height and weight and the calculated BMI.  Apparent distress due to neck and upper extremity symptoms.   Head: Normocephalic, atraumatic.  Skin: Inspection of the skin reveals no rashes, lesions or infection.  CV: Regular rate. No murmurs or rubs noted. No peripheral edema noted.  Pulm: Respirations are even and unlabored.  Extr: No clubbing, cyanosis, or edema noted.  Musculoskeletal:  1. Cervical spine ???decreased range of motion all axes , decreased extension .   Paraspinous tenderness bilateral .   There is no scoliosis, asymmetry, or musculoskeletal defect.  2. Thoracic spine ??? Full ROM.  No paraspinous tenderness at any level.  There is no scoliosis, asymmetry, or musculoskeletal defect.  3. Lumbar spine ??? Full ROM.  No paraspinous tenderness at any level.  SI joints are nontender bilaterally. There is no scoliosis, asymmetry, or musculoskeletal defect.  4. Right upper extremity ??? Full ROM.  5/5 muscle strength in all muscle groups. No pain or tenderness in shoulder, elbow, wrist, or hand.  5. Left upper extremity ??? Full ROM.  5/5 muscle strength in all muscle  groups.  No pain or tenderness in shoulder, elbow, wrist, or hand.  6. Right lower extremity ??? Full ROM.  5/5 muscle strength in all muscle groups. No pain, tenderness, or swelling in the hip, knee, ankle or foot.    7. Left lower extremity ??? Full ROM.  5/5 muscle strength in all muscle groups.  No pain, tenderness, or swelling in the hip, knee, ankle or foot.    Neurological:  1. Mental Status - Alert, awake and oriented. Speech is clear and appropriate.  2. Cranial Nerves - Extraocular muscles intact bilaterally. Cranial nerves II-XII grossly intact bilaterally.  3. Gait - Non-antalgic   4. Reflexes - 2+ and symmetric throughout.  5. Sensation - Intact to light touch and pin prick.   6. Provocative Tests - Spurling???s negative bilaterally. Straight leg raise negative bilaterally.   Psychological:  1. Mood and affect ??? Appropriate.  2. Speech ???   Appropriate.  3. Though content ??? Appropriate.  4. Judgment ??? Appropriate.    ASSESSMENT:      ICD-10-CM ICD-9-CM    1. Cervical radiculitis M54.12 723.4    2. Degenerative disc disease, cervical M50.30 722.4    3. Cervical facet syndrome (HCC) M46.92 723.8    4. Chronic pain syndrome G89.4 338.4    5. Cervicalgia M54.2 723.1            PLAN:    1.    I have thoroughly discussed the risks and benefits, indications, contraindications, and side effects of any/all procedures that were mentioned at today's patient visit. I have used a skeleton spine model when indicated to explain all procedures, as well as to provide added emphasis regarding procedures and as well for patient education purposes.       I have answered all questions in great detail, and I have obtained verbal and written confirmation for all procedures planned with the patient.   3.    I have reviewed in great detail today, when indicated, the patient's MRI and other imaging studies with the patient. I have explained to the patient, when indicated, their condition using both actual recent and  relevant images insofar as I am able to obtain these images. I have used a skeleton spine model for added emphasis as well as for patient education.      4.    I have advised patient to have a primary care provider continue to care for their health maintenance and general medical conditions.   5,    I have placed appropriate referrals to specialty care providers as I have deemed necessary through today's clinical consultation with the patient.  5.    I have explained to the patient that if any significant side effects, issues, problems, concerns, or perceived complications as may arise at around the time of the patient's procedures, they should either call the pain management clinic or go to the emergency room immediately for medical provider evaluation.   6.   I have encouraged all patients to call the pain management clinic with any questions or concerns that they may have pertaining to their procedures.     DISPOSITION:   The patient???s condition and plan were discussed at length and all questions were answered.  The patient agrees with the plan.    A total of 25 minutes was spent with the patient of which over half of the time was spent counseling the patient.     Teion Ballin H [place] Cinqu[place]ani, MD 05/11/2017 11:23 AM    Note: Although these clinic notes were documented by the provider at the time of the exam, they have not been proofed and are subject to transcription variance.

## 2017-05-11 NOTE — Addendum Note (Signed)
Addended by: Kinslei Labine H on: 05/11/2017 02:39 PM      Modules accepted: Orders

## 2017-05-11 NOTE — Progress Notes (Signed)
The pt is here today to discuss procedure options with Dr. Elio ForgetHenick. Pt rates pain as 7/10.   PHQ over the last two weeks 05/11/2017   Little interest or pleasure in doing things Not at all   Feeling down, depressed, irritable, or hopeless Not at all   Total Score PHQ 2 0

## 2017-05-11 NOTE — H&P (View-Only) (Signed)
[facility]  Interventional Pain Management Consultation History & Physical    PATIENT NAME:  Deborah Williams     DATE OF BIRTH:   03-11-1971    DATE OF SERVICE:   05/11/2017      CHIEF COMPLAINT:  Neck Pain (right into shoulder) and Head Pain      REASON FOR VISIT:   Deborah Williams presents to the pain clinic today for  follow on evaluation and to consider interventional pain management options as indicated for the type and location of the pain the patient is presenting with.            HISTORY OF PRESENT ILLNESS:     Patient returns for follow on evaluation and to discuss further procedures as may be necessary.  I am also asked to complete an updated history and physical exam within 30 days of anticipated upcoming procedure.     At today's evaluation, patient has a recurrent of her neck pain, and as well bilateral upper extremity radiating pain and numbness tingling.  She is known very well to our pain clinic.  I initially saw her Feb 23, 2017.  I found her to have signs and symptoms, as well as exam and imaging evidence suggestive of cervical radiculitis secondary to cervical degenerative disc disease, neuroforaminal narrowing.  She has undergone 2 cervical epidural steroid injections with IV conscious sedation.  These have helped her back and radiating upper extremity symptoms significantly.  She has had a recurrence of her symptoms.  He endorses pain of her neck and both arms.  She has numbness and tingling down both arms.  She has difficulty raising her arms above her shoulders.  She has difficulty working above her head.  She has difficulty lifting heavier objects.  Difficulty with repetitive motion including vacuuming, sweeping, scrubbing.  Unfortunately, her job requires her to do these activities.  This exacerbates her neck and upper extremity symptoms.      We reviewed her cervical imaging again.  Cervical MRI dated January 09, 2017 shows disc bulges C4-5, moderate canal stenosis, mild bilateral neuroforaminal narrowing C4-5.  Disc protrusion C6-7.  Neuroforaminal narrowing.  Anterior cervical disc fusion C5-6.      ASSESSMENT/OPTIONS: as follows.  We discussed options.  We will plan repeating cervical epidural steroid injection at C7-T1 level with IV conscious sedation.  This has helped her quite significantly in the past, I believe it will continue to do so.  Patient can also continue her other pain management medications provided by other providers.  Perhaps another course of physical therapy may help patient.  We will see after this injection.  I have discussed the risks and benefits, indications, contraindications, and side effects of intended procedure with the patient.  I have used skeleton spine model to describe and discuss the procedure with the patient.  I have answered all questions relating to the procedure.  Patient understands the nature of the procedure and wishes to proceed.  Patient has no further questions.               MRI Results (most recent):  No results found for this or any previous visit.        PAST MEDICAL HISTORY:   The patient  has no past medical history on file.    PAST SURGICAL HISTORY:   The patient  has a past surgical history that includes hx tonsillectomy; hx dilation and curettage; hx cholecystectomy; and hx cervical  fusion.    CURRENT MEDICATIONS:   The patient has a current medication list which includes the following prescription(s): pregabalin, tizanidine, celecoxib, gabapentin, clonidine hcl, estradiol, and meloxicam.    ALLERGIES:     Allergies   Allergen Reactions   ??? Daypro [Oxaprozin] Hives       FAMILY HISTORY:   The patient family history is not on file.    SOCIAL HISTORY:   The patient  reports that she has quit smoking. She has never used smokeless tobacco. The patient  reports that she does not drink alcohol. She      REVIEW OF SYSTEMS:    The patient denies fever, chills, weight loss  (Constitutional), rash, itching (Skin), tinnitus, congestion (HENT), blurred vision, photophobia (Eyes), palpitations, orthopnea (Cardiovascular), hemoptysis, wheezing (Respiratory), nausea, vomiting, diarrhea (Gastrointestinal), dysuria, hematuria, urgency (Genitourinary), bowel or bladder incontinence, loss of consciousness (Neurologic), suicidal or homicidal ideation or hallucinations (Psychiatric). Denies swelling, axillary or groin masses (Lymphatic).           PHYSICAL EXAM:  VS:   Visit Vitals   ??? BP 132/60 (BP 1 Location: Left arm, BP Patient Position: Sitting)   ??? Pulse 81   ??? Temp 97.3 ??F (36.3 ??C) (Oral)   ??? Resp 14   ??? Ht 5' 1.5" (1.562 m)   ??? Wt 90.7 kg (200 lb)   ??? LMP 04/11/2017   ??? BMI 37.18 kg/m2     General: Well-developed and well-nourished. Body habitus consistent with recorded height and weight and the calculated BMI.  Apparent distress due to neck and upper extremity symptoms.   Head: Normocephalic, atraumatic.  Skin: Inspection of the skin reveals no rashes, lesions or infection.  CV: Regular rate. No murmurs or rubs noted. No peripheral edema noted.  Pulm: Respirations are even and unlabored.  Extr: No clubbing, cyanosis, or edema noted.  Musculoskeletal:  1. Cervical spine ???decreased range of motion all axes , decreased extension .   Paraspinous tenderness bilateral .   There is no scoliosis, asymmetry, or musculoskeletal defect.  2. Thoracic spine ??? Full ROM.  No paraspinous tenderness at any level.  There is no scoliosis, asymmetry, or musculoskeletal defect.  3. Lumbar spine ??? Full ROM.  No paraspinous tenderness at any level.  SI joints are nontender bilaterally. There is no scoliosis, asymmetry, or musculoskeletal defect.  4. Right upper extremity ??? Full ROM.  5/5 muscle strength in all muscle groups. No pain or tenderness in shoulder, elbow, wrist, or hand.  5. Left upper extremity ??? Full ROM.  5/5 muscle strength in all muscle  groups.  No pain or tenderness in shoulder, elbow, wrist, or hand.  6. Right lower extremity ??? Full ROM.  5/5 muscle strength in all muscle groups. No pain, tenderness, or swelling in the hip, knee, ankle or foot.    7. Left lower extremity ??? Full ROM.  5/5 muscle strength in all muscle groups.  No pain, tenderness, or swelling in the hip, knee, ankle or foot.    Neurological:  1. Mental Status - Alert, awake and oriented. Speech is clear and appropriate.  2. Cranial Nerves - Extraocular muscles intact bilaterally. Cranial nerves II-XII grossly intact bilaterally.  3. Gait - Non-antalgic   4. Reflexes - 2+ and symmetric throughout.  5. Sensation - Intact to light touch and pin prick.   6. Provocative Tests - Spurling???s negative bilaterally. Straight leg raise negative bilaterally.   Psychological:  1. Mood and affect ??? Appropriate.  2. Speech ???  Appropriate.  3. Though content ??? Appropriate.  4. Judgment ??? Appropriate.    ASSESSMENT:      ICD-10-CM ICD-9-CM    1. Cervical radiculitis M54.12 723.4    2. Degenerative disc disease, cervical M50.30 722.4    3. Cervical facet syndrome (HCC) M46.92 723.8    4. Chronic pain syndrome G89.4 338.4    5. Cervicalgia M54.2 723.1            PLAN:    1.    I have thoroughly discussed the risks and benefits, indications, contraindications, and side effects of any/all procedures that were mentioned at today's patient visit. I have used a skeleton spine model when indicated to explain all procedures, as well as to provide added emphasis regarding procedures and as well for patient education purposes.       I have answered all questions in great detail, and I have obtained verbal and written confirmation for all procedures planned with the patient.   3.    I have reviewed in great detail today, when indicated, the patient's MRI and other imaging studies with the patient. I have explained to the patient, when indicated, their condition using both actual recent and  relevant images insofar as I am able to obtain these images. I have used a skeleton spine model for added emphasis as well as for patient education.      4.    I have advised patient to have a primary care provider continue to care for their health maintenance and general medical conditions.   5,    I have placed appropriate referrals to specialty care providers as I have deemed necessary through today's clinical consultation with the patient.  5.    I have explained to the patient that if any significant side effects, issues, problems, concerns, or perceived complications as may arise at around the time of the patient's procedures, they should either call the pain management clinic or go to the emergency room immediately for medical provider evaluation.   6.   I have encouraged all patients to call the pain management clinic with any questions or concerns that they may have pertaining to their procedures.     DISPOSITION:   The patient???s condition and plan were discussed at length and all questions were answered.  The patient agrees with the plan.    A total of 25 minutes was spent with the patient of which over half of the time was spent counseling the patient.     Deborah Gitelman Leda [place], MD 05/11/2017 11:23 AM    Note: Although these clinic notes were documented by the provider at the time of the exam, they have not been proofed and are subject to transcription variance.

## 2017-05-15 ENCOUNTER — Ambulatory Visit: Admit: 2017-05-15 | Payer: PRIVATE HEALTH INSURANCE

## 2017-05-15 ENCOUNTER — Inpatient Hospital Stay: Payer: PRIVATE HEALTH INSURANCE

## 2017-05-15 MED ORDER — FENTANYL CITRATE (PF) 50 MCG/ML IJ SOLN
50 mcg/mL | INTRAMUSCULAR | Status: DC | PRN
Start: 2017-05-15 — End: 2017-05-15
  Administered 2017-05-15: 19:00:00 via INTRAVENOUS

## 2017-05-15 MED ORDER — MIDAZOLAM 1 MG/ML IJ SOLN
1 mg/mL | INTRAMUSCULAR | Status: AC
Start: 2017-05-15 — End: ?

## 2017-05-15 MED ORDER — FENTANYL CITRATE (PF) 50 MCG/ML IJ SOLN
50 mcg/mL | INTRAMUSCULAR | Status: AC
Start: 2017-05-15 — End: ?

## 2017-05-15 MED ORDER — LIDOCAINE (PF) 10 MG/ML (1 %) IJ SOLN
10 mg/mL (1 %) | INTRAMUSCULAR | Status: DC | PRN
Start: 2017-05-15 — End: 2017-05-15
  Administered 2017-05-15: 19:00:00

## 2017-05-15 MED ORDER — SODIUM CHLORIDE 0.9 % IJ SYRG
INTRAMUSCULAR | Status: DC | PRN
Start: 2017-05-15 — End: 2017-05-15
  Administered 2017-05-15: 19:00:00 via INTRAVENOUS

## 2017-05-15 MED ORDER — LIDOCAINE (PF) 10 MG/ML (1 %) IJ SOLN
10 mg/mL (1 %) | INTRAMUSCULAR | Status: AC
Start: 2017-05-15 — End: ?

## 2017-05-15 MED ORDER — IOPAMIDOL 41 % INTRATHECAL
200 mg iodine /mL (41 %) | INTRATHECAL | Status: AC
Start: 2017-05-15 — End: ?

## 2017-05-15 MED ORDER — DEXAMETHASONE SODIUM PHOSPHATE (PF) 10 MG/ML INJECTION
10 mg/mL | INTRAMUSCULAR | Status: AC
Start: 2017-05-15 — End: ?

## 2017-05-15 MED ORDER — DEXAMETHASONE SODIUM PHOSPHATE 10 MG/ML IJ SOLN
10 mg/mL | INTRAMUSCULAR | Status: DC | PRN
Start: 2017-05-15 — End: 2017-05-15
  Administered 2017-05-15: 19:00:00

## 2017-05-15 MED ORDER — IOPAMIDOL 41 % INTRATHECAL
200 mg iodine /mL (41 %) | INTRATHECAL | Status: DC | PRN
Start: 2017-05-15 — End: 2017-05-15
  Administered 2017-05-15: 19:00:00

## 2017-05-15 MED ORDER — MIDAZOLAM 1 MG/ML IJ SOLN
1 mg/mL | INTRAMUSCULAR | Status: DC | PRN
Start: 2017-05-15 — End: 2017-05-15
  Administered 2017-05-15: 19:00:00 via INTRAVENOUS

## 2017-05-15 MED FILL — ISOVUE-M 200  41 % INTRATHECAL SOLUTION: 200 mg iodine /mL (41 %) | INTRATHECAL | Qty: 10

## 2017-05-15 MED FILL — MIDAZOLAM 1 MG/ML IJ SOLN: 1 mg/mL | INTRAMUSCULAR | Qty: 2

## 2017-05-15 MED FILL — LIDOCAINE (PF) 10 MG/ML (1 %) IJ SOLN: 10 mg/mL (1 %) | INTRAMUSCULAR | Qty: 30

## 2017-05-15 MED FILL — FENTANYL CITRATE (PF) 50 MCG/ML IJ SOLN: 50 mcg/mL | INTRAMUSCULAR | Qty: 2

## 2017-05-15 MED FILL — DEXAMETHASONE SODIUM PHOSPHATE (PF) 10 MG/ML INJECTION: 10 mg/mL | INTRAMUSCULAR | Qty: 2

## 2017-05-15 MED FILL — BD POSIFLUSH NORMAL SALINE 0.9 % INJECTION SYRINGE: INTRAMUSCULAR | Qty: 10

## 2017-05-15 NOTE — Procedures (Signed)
[facility]    INTERLAMINAR CERVICAL EPIDURAL STEROID INJECTION  PROCEDURE REPORT      PATIENT:  Deborah Williams  DATE OF BIRTH:  07-Sep-1971  DATE OF SERVICE:  05/15/2017  [facility]    PRE-PROCEDURE DIAGNOSIS:  See Above    POST-PROCEDURE DIAGNOSIS:  See Above                PROCEDURE:    1. Interlaminar cervical epidural steroid injection, C7-T1  ([id number])  2. Fluoroscopic needle guidance (spinal) ([id number])  3. Supervision of moderate sedation [phone])    ANESTHESIA:  Local with moderate IV sedation. See Medication Administration Record for specific medications and dosage.    COMPLICATIONS: None.    PHYSICIAN:  Seth BakeJames H Marvon Shillingburg, MD    PRE-PROCEDURE NOTE:  Pre-procedural assessment of the patient was performed including a limited history and physical examination.  The details of the procedure were discussed with the patient, including the risks, benefits and alternative options and an informed consent was obtained. The patient???s NPO status, if necessary for the specific procedure and/or administration of moderate intravenous sedation, if utilized, and availability of a responsible adult to escort the patient following the procedure were confirmed.    A peripheral intravenous cannula was placed without difficulty and lactated Ringer???s solution administered. See nursing notes for details.    PROCEDURE NOTE:  The patient was brought to the procedure suite and positioned on the fluoroscopy table in the prone position. Physiologic monitors were applied and supplemental oxygen was administered via nasal cannula. The skin was prepped in the standard surgical fashion and sterile drapes were applied over the procedure site. Please refer to the Flowsheet for documentation of the patient???s vital signs and the Medication Administration Record for any oral and/or intravenous sedation administered prior to or during the  procedure.    The above-listed interlaminar space was identified and the skin and subcutaneous tissues were infiltrated with 1% Lidocaine.  Under anterior-posterior fluoroscopic guidance an 18g, 3.5-inch Tuohy epidural needle was advanced along the previously identified interlaminar space.  The fluoroscope was then turned lateral view and a loss of resistance syringe was attached to the needle containing preservative free normal saline. Under lateral flouroscopic guidance, the needle was then advanced through the ligamentum flavum, entering the epidural space with a clear and crisp loss of resistance. The needle was not advanced beyond the interlaminar line at any time during this process. No CSF was noted. Aspiration was negative for blood or CSF.  Additional confirmation was made with the injection of 0.5 mL of a nonionic water-soluble radiographic contrast medium ([id number]-M 200) demonstrating appropriate epidural spread and the absence of vascular uptake.       Following this, a 3 mL solution comprised of 2 mL of dexamethasone (10mg /ml) and 1 mL of lidocaine 1% preservative free was injected slowly after negative aspiration. The needle was cleared of steroid solution and removed. The area was thoroughly cleaned and sterile bandages applied as necessary. The patient tolerated the procedure well and vital signs remained stable throughout the procedure.    POST-PROCEDURE COURSE:  The patient was escorted from the procedure suite in satisfactory condition and recovered per facility protocol based on the type of procedure performed and/or the sedation utilized. The patient did not experience any adverse events and remained hemodynamically stable during the post-procedure period.    DISCHARGE NOTE:  Upon discharge, the patient was able to  tolerate fluids and was in no acute distress.  The patient was oriented to person, place and time and vital signs were stable. Appropriate post-procedure instructions were provided and  explained to the patient in detail and all questions were answered.    Seth BakeJames H Elisandra Deshmukh, MD 05/15/2017 2:57 PM

## 2017-05-15 NOTE — Interval H&P Note (Signed)
H&P Update:  Deborah Williams was seen and examined.  History and physical has been reviewed. The patient has been examined. There have been no significant clinical changes since the completion of the originally dated History and Physical.    [facility]  Brief Pre-Procedure History & Physical    PATIENT NAME:  Deborah Williams   DATE OF BIRTH:  12-30-1970  DATE OF SERVICE:  05/15/2017      CHIEF COMPLAINT:  Pain    HISTORY OF PRESENT ILLNESS:  Deborah Williams presents today for a previously diagnosed problem contributing to some or all of this patient???s pain. The location and pattern of the pain has not changed substantially since the last visit in our office. No other significant medical changes have occurred in the last 30 days.    PAST MEDICAL HISTORY:  The patient  has no past medical history on file.    PAST SURGICAL HISTORY:  The patient  has a past surgical history that includes hx tonsillectomy; hx dilation and curettage; hx cholecystectomy; and hx cervical fusion.    CURRENT MEDICATIONS:  See Medication Administration Record [facility]) in the patient's electronic record.    ALLERGIES:    Allergies   Allergen Reactions   ??? Daypro [Oxaprozin] Hives       FAMILY HISTORY:  The patient family history is not on file.    SOCIAL HISTORY:  The patient  reports that she has quit smoking. She has never used smokeless tobacco. The patient  reports that she does not drink alcohol. She  reports that she does not use illicit drugs.     REVIEW OF SYSTEMS:  Deborah Williams denies any fever, chills, unexplained weight loss, use of antibiotics for recent infection or bleeding abnormalities.    PHYSICAL EXAM:  VS:   Visit Vitals   ??? BP 128/69 (BP 1 Location: Right arm, BP Patient Position: Prone)   ??? Pulse 86   ??? Temp 98.2 ??F (36.8 ??C)   ??? Resp 19   ??? Ht 5' 1.5" (1.562 m)   ??? Wt 90.7 kg (200 lb)   ??? SpO2 100%   ??? BMI 37.18 kg/m2      Gen: Well-developed. Body habitus consistent with recorded height and weight and the calculated BMI. No apparent distress.  Head: Normocephalic, atraumatic.  Skin: No obvious rashes, lesions or infection.  Pulm: Respirations are even and unlabored.   Psych:    Mood, affect and speech ??? Appropriate.    ASSESSMENT:   1. Stable for crvical ESI interventional pain procedure as discussed.      PLAN:  Proceed with scheduled procedure.     Seth BakeJames H Domonique Brouillard, MD 05/15/2017 2:46 PM        Signed By: Seth BakeJames H Jocie Meroney, MD     May 15, 2017 2:46 PM

## 2017-05-15 NOTE — Procedures (Signed)
[facility]    INTERLAMINAR CERVICAL EPIDURAL STEROID INJECTION  PROCEDURE REPORT      PATIENT:  Deborah Williams  DATE OF BIRTH:  [birth year]  DATE OF SERVICE:  05/15/2017  [facility]    PRE-PROCEDURE DIAGNOSIS:  See Above    POST-PROCEDURE DIAGNOSIS:  See Above                PROCEDURE:    1. Interlaminar cervical epidural steroid injection, C7-T1  ([id number])  2. Fluoroscopic needle guidance (spinal) ([id number])  3. Supervision of moderate sedation [phone])    ANESTHESIA:  Local with moderate IV sedation. See Medication Administration Record for specific medications and dosage.    COMPLICATIONS: None.    PHYSICIAN:  Seth BakeJames H Kindrick Lankford, MD    PRE-PROCEDURE NOTE:  Pre-procedural assessment of the patient was performed including a limited history and physical examination.  The details of the procedure were discussed with the patient, including the risks, benefits and alternative options and an informed consent was obtained. The patient???s NPO status, if necessary for the specific procedure and/or administration of moderate intravenous sedation, if utilized, and availability of a responsible adult to escort the patient following the procedure were confirmed.    A peripheral intravenous cannula was placed without difficulty and lactated Ringer???s solution administered. See nursing notes for details.    PROCEDURE NOTE:  The patient was brought to the procedure suite and positioned on the fluoroscopy table in the prone position. Physiologic monitors were applied and supplemental oxygen was administered via nasal cannula. The skin was prepped in the standard surgical fashion and sterile drapes were applied over the procedure site. Please refer to the Flowsheet for documentation of the patient???s vital signs and the Medication Administration Record for any oral and/or intravenous sedation  administered prior to or during the procedure.    The above-listed interlaminar space was identified and the skin and subcutaneous tissues were infiltrated with 1% Lidocaine.  Under anterior-posterior fluoroscopic guidance an 18g, 3.5-inch Tuohy epidural needle was advanced along the previously identified interlaminar space.  The fluoroscope was then turned lateral view and a loss of resistance syringe was attached to the needle containing preservative free normal saline. Under lateral flouroscopic guidance, the needle was then advanced through the ligamentum flavum, entering the epidural space with a clear and crisp loss of resistance. The needle was not advanced beyond the interlaminar line at any time during this process. No CSF was noted. Aspiration was negative for blood or CSF.  Additional confirmation was made with the injection of 0.5 mL of a nonionic water-soluble radiographic contrast medium (Isovue-M 200) demonstrating appropriate epidural spread and the absence of vascular uptake.       Following this, a 3 mL solution comprised of 2 mL of dexamethasone (10mg /ml) and 1 mL of lidocaine 1% preservative free was injected slowly after negative aspiration. The needle was cleared of steroid solution and removed. The area was thoroughly cleaned and sterile bandages applied as necessary. The patient tolerated the procedure well and vital signs remained stable throughout the procedure.    POST-PROCEDURE COURSE:  The patient was escorted from the procedure suite in satisfactory condition and recovered per facility protocol based on the type of procedure performed and/or the sedation utilized. The patient did not experience any adverse events and remained hemodynamically stable during the post-procedure period.    DISCHARGE NOTE:  Upon discharge, the patient was able to  tolerate fluids and was in no acute distress.  The patient was oriented to person, place  and time and vital signs were stable. Appropriate post-procedure instructions were provided and explained to the patient in detail and all questions were answered.    Seth BakeJames H Lizzie An, MD 05/15/2017 2:57 PM

## 2017-05-22 NOTE — Telephone Encounter (Signed)
Ms. Deborah Williams was contacted for follow-up status post Interlaminar cervical epidural steroid injection, C7-T1  on May 15, 2017. She reports:    Pre-procedure numerical pain score: 7/10  Post-procedure numerical pain score immediately after: 5/10  Duration of relief post-procedure (if applicable): 1 weeks  Improvement in functional activities (if applicable): Yes  Percentage of overall improvement: 10%    COMMENTS:

## 2024-02-15 IMAGING — DX CERVICAL SPINE 4 VIEWS
1 series · 4 of 4 positions shown · non-contrast
Comparison: No prior cervical examination

________________________________________________________________________________________________ 
CERVICAL SPINE 4 VIEWS, 02/15/2024 [DATE]: 
CLINICAL INDICATION: Cervical spondylosis

[Series 1: lateral · 0.14mm/px · 4 of 4 slices shown]
[im 1/4]
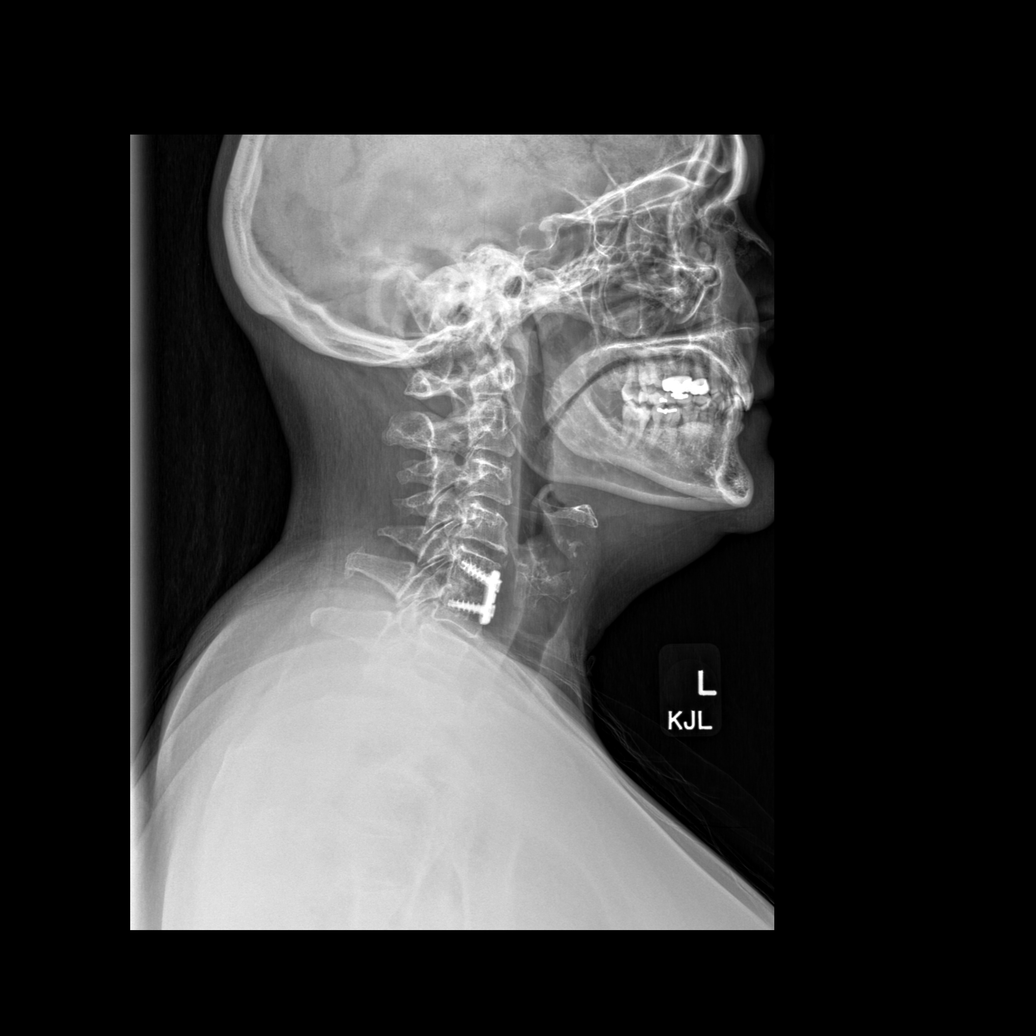
[im 2/4]
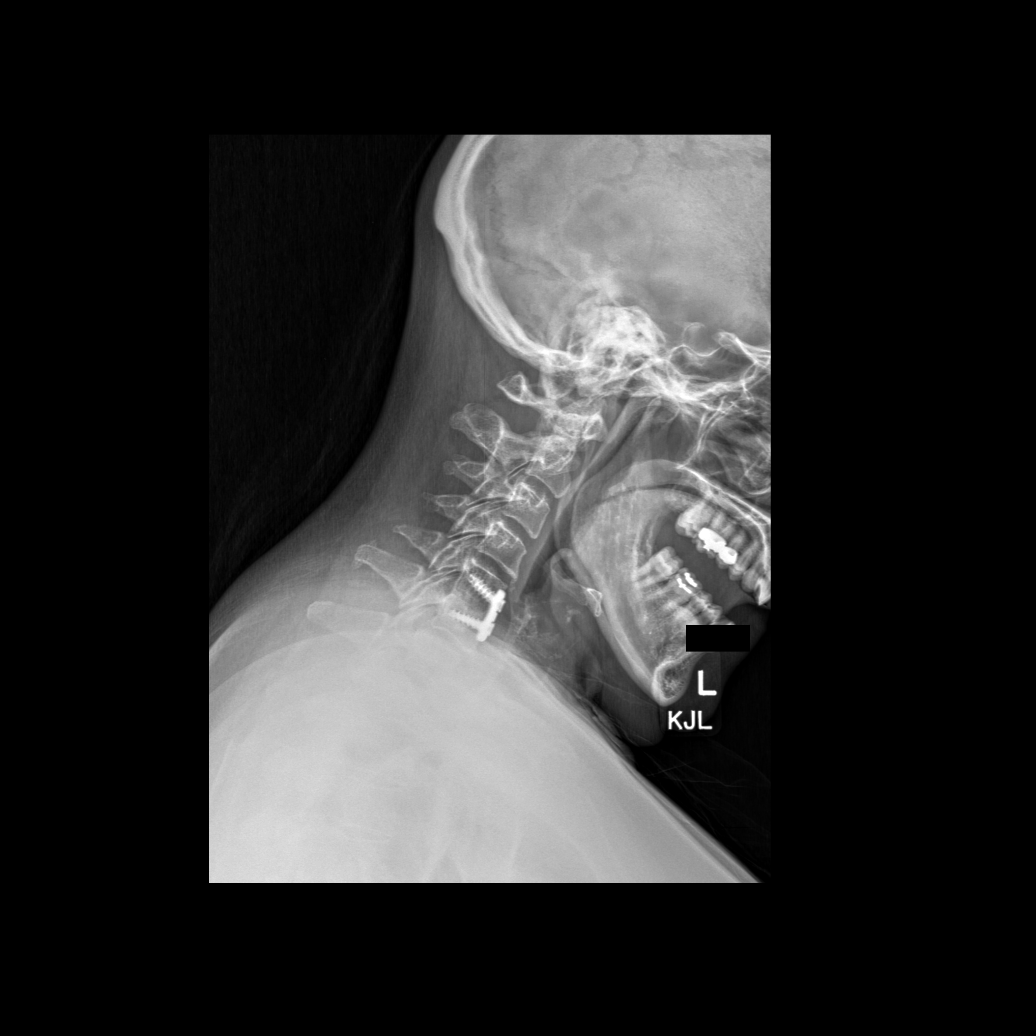
[im 3/4]
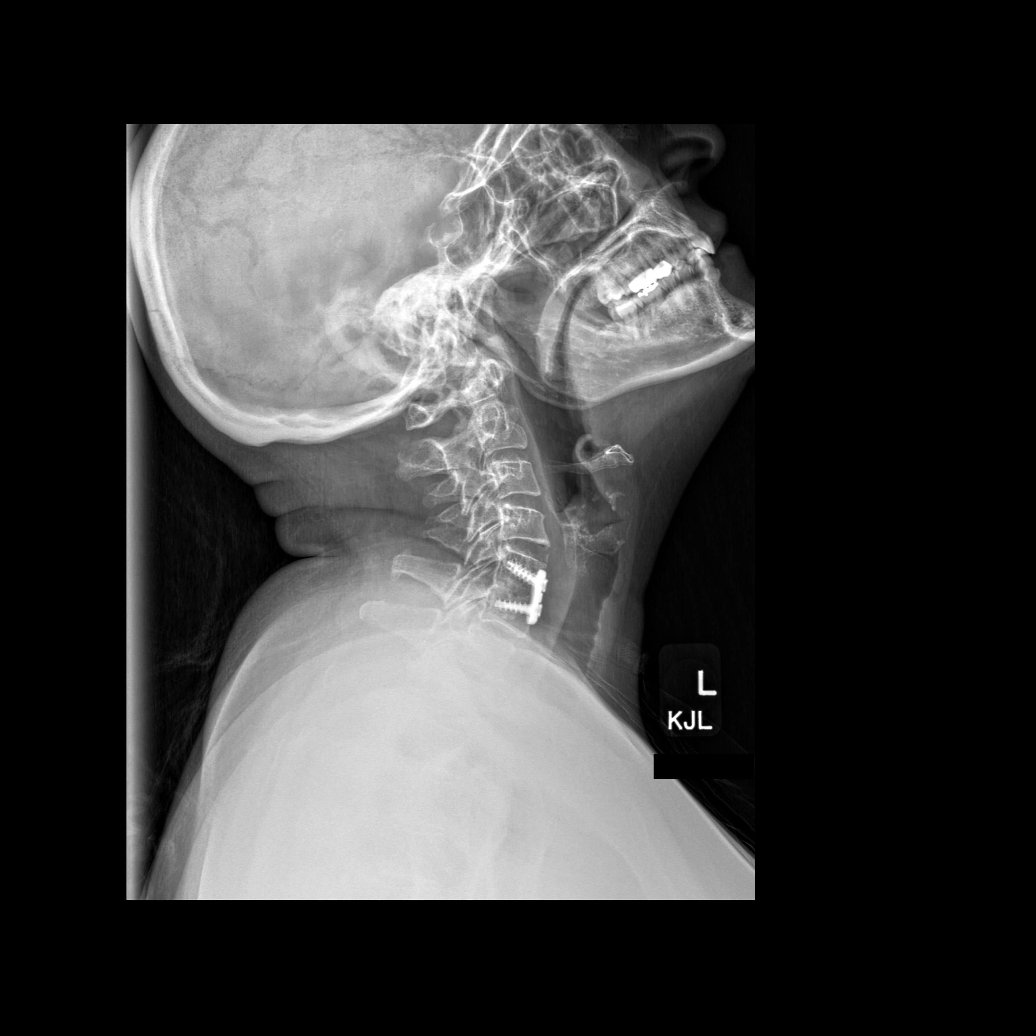
[im 4/4]
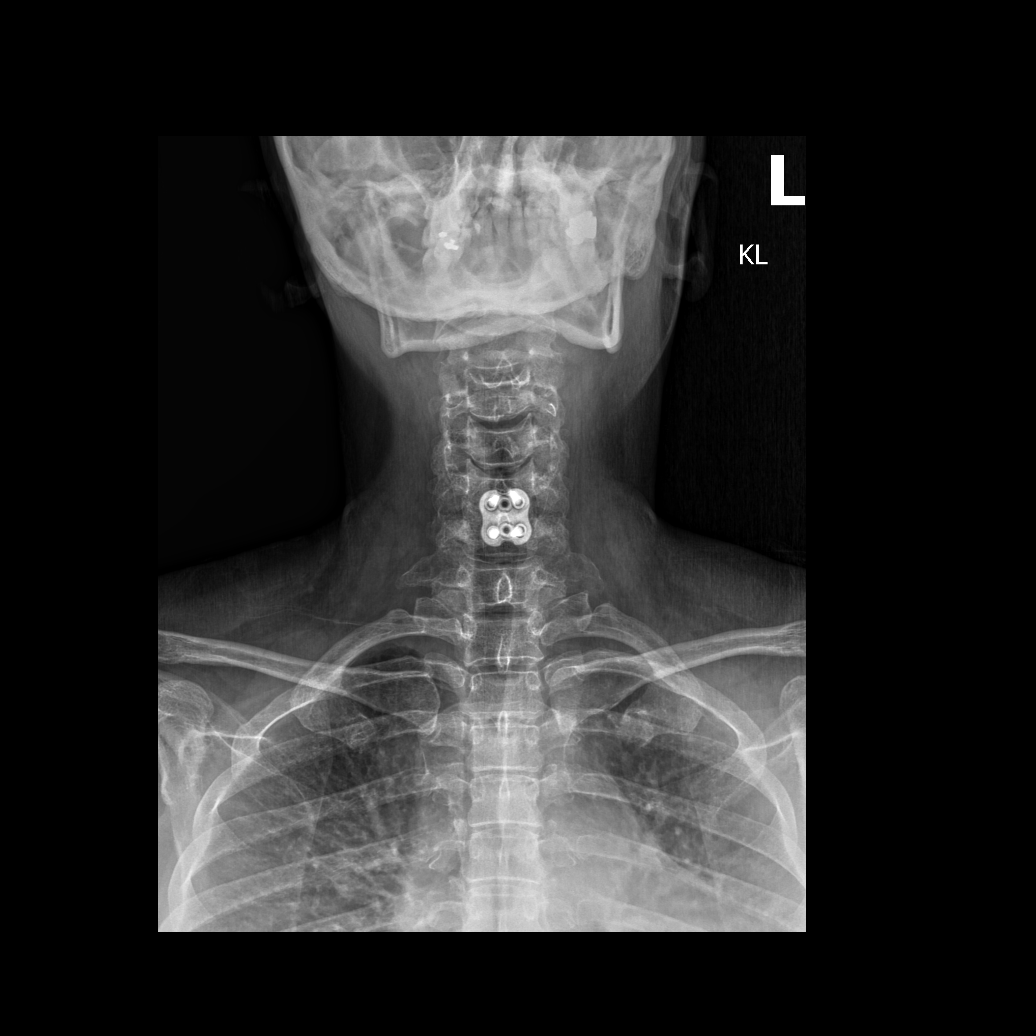

[4 of 4 positions shown; findings below may reference images not displayed]

FINDINGS: Cervical vertebral heights are intact. Patient is status post C5-6 
ACDF with anterior plate and screw fusion. Hardware appears appropriately 
positioned. 
Flexion-extension views show no hardware mobility or subluxation. The dens 
appears intact. There are zygapophyseal facet degenerative changes.
IMPRESSION: Status post C5-6 ACDF. No significant disc space narrowing at other cervical 
level.

## 2024-02-15 IMAGING — DX SACROILIAC JOINT 3 VIEWS
1 series · 3 of 3 positions shown · non-contrast
Comparison: None.

________________________________________________________________________________________________ 
HIP BILATERAL WITH PELVIS 5 VIEWS, SACROILIAC JOINT 3 VIEWS, 02/15/2024 [DATE]: 
CLINICAL INDICATION: Pain.

[Series 1: AP · 0.14mm/px · 3 of 3 slices shown]
[im 1/3]
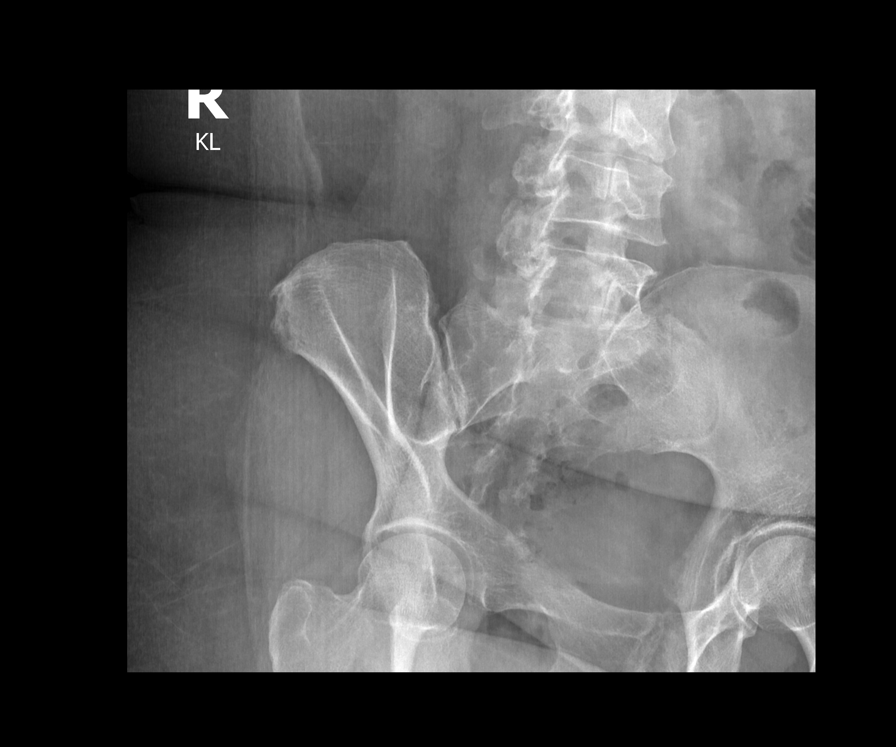
[im 2/3]
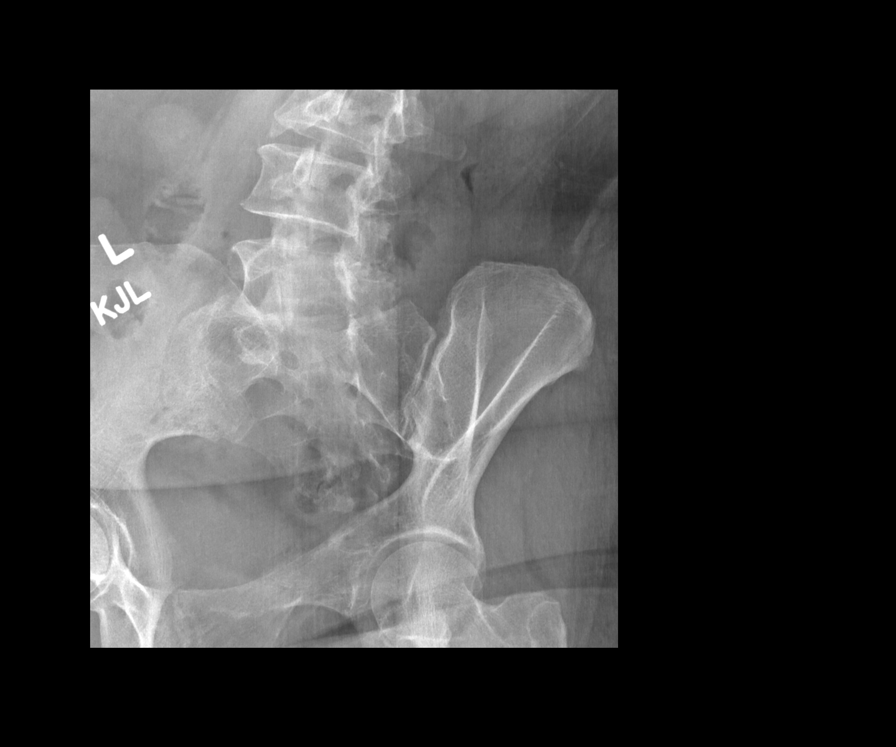
[im 3/3]
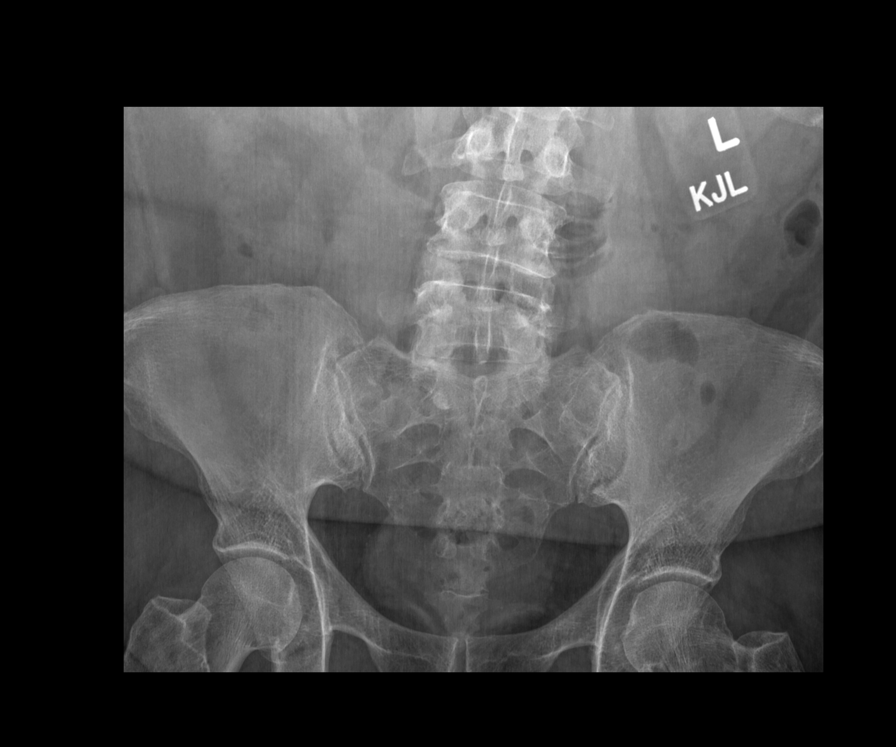

[3 of 3 positions shown; findings below may reference images not displayed]

FINDINGS: No fractures or dislocations. Hip joint spaces are preserved. Mild 
right iliac enthesopathy. Mild degenerative change of the SI joints and 
multilevel degenerative change of the spine. Normal bone density. No soft tissue 
swelling.
IMPRESSION: 1.  Hip joint spaces are preserved.  
2.  Mild degenerative change of the SI joints and multilevel degenerative change 
of the spine.

## 2024-02-15 IMAGING — DX LUMBAR SPINE AP, LAT WITH FLEXION AND EXTEN
1 series · 4 of 4 positions shown · non-contrast
Comparison: No prior lumbar examination

________________________________________________________________________________________________ 
LUMBAR SPINE AP, LAT WITH FLEXION AND EXTEN, 02/15/2024 [DATE]: 
CLINICAL INDICATION:  Inflammation of sacroiliac joints, lumbar spondylosis

[Series 1: lateral · 0.14mm/px · 4 of 4 slices shown]
[im 1/4]
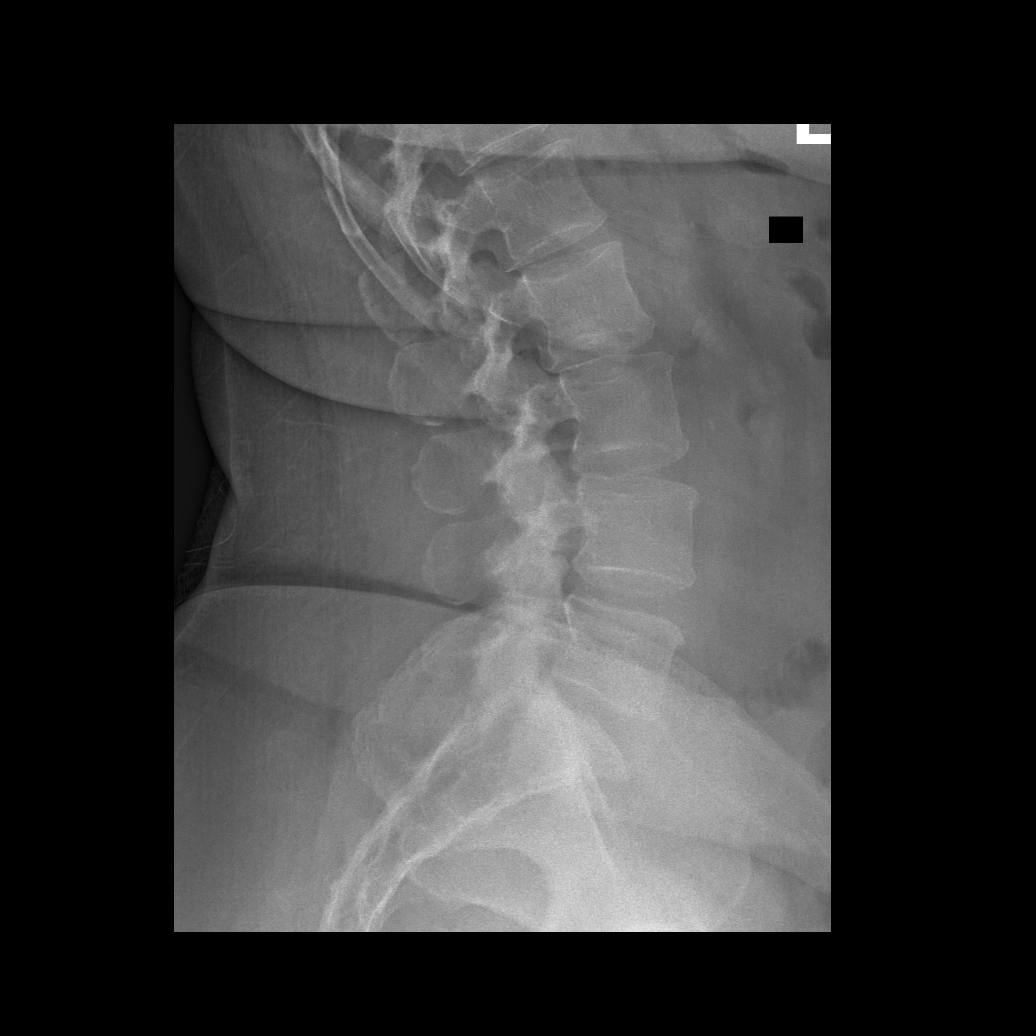
[im 2/4]
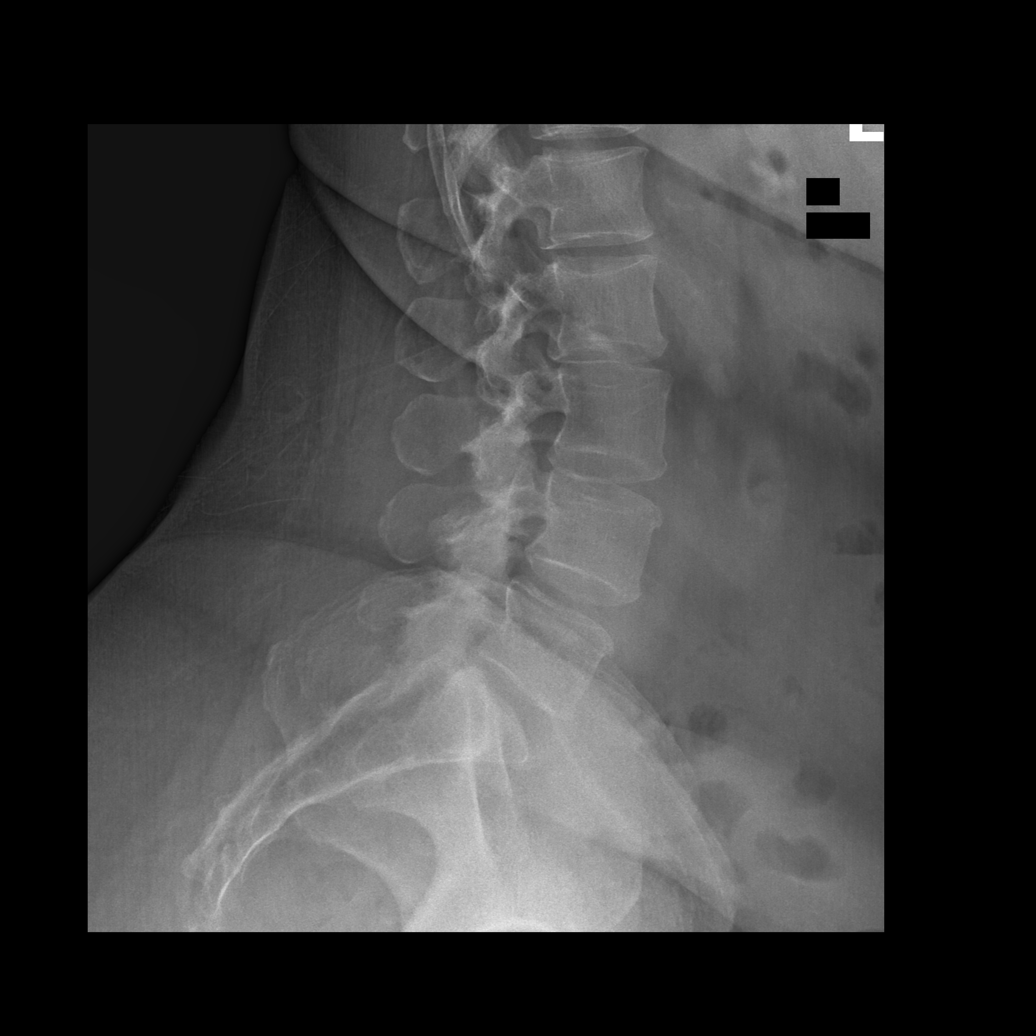
[im 3/4]
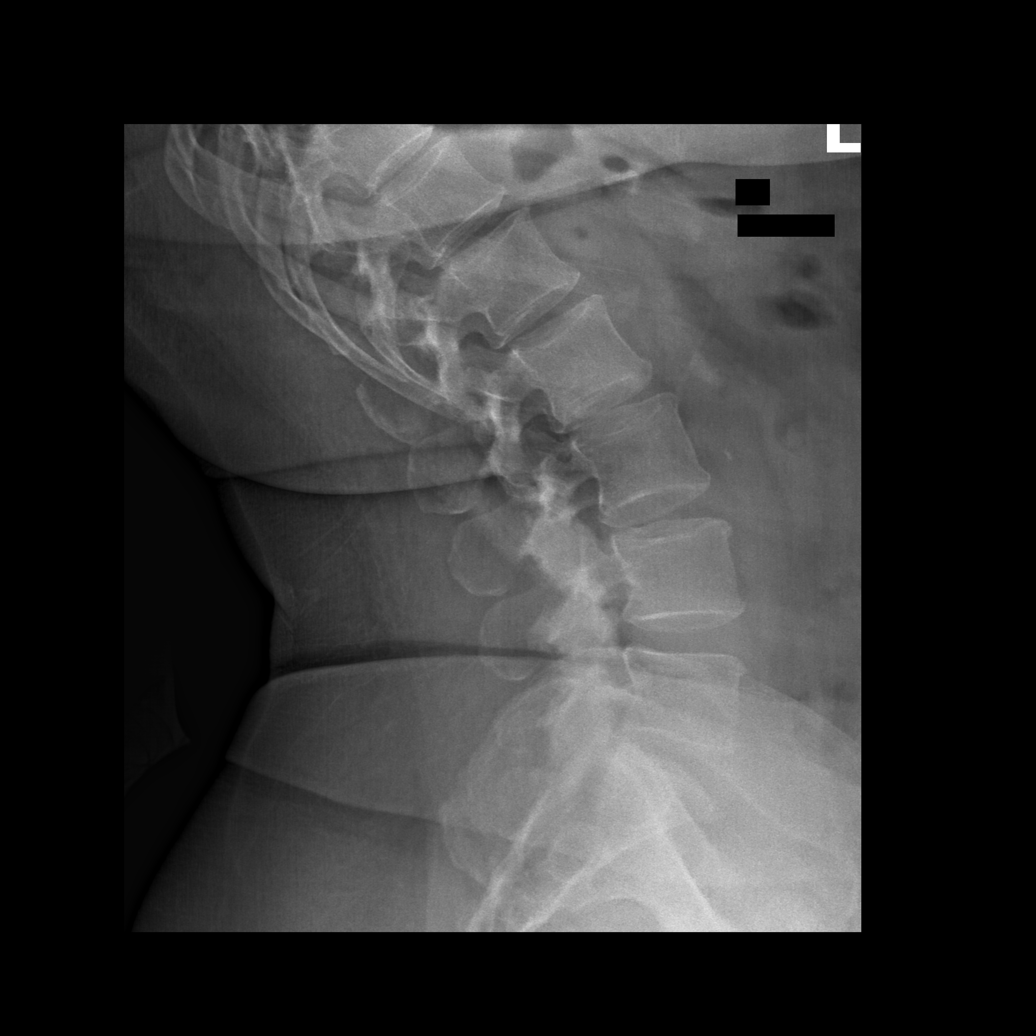
[im 4/4]
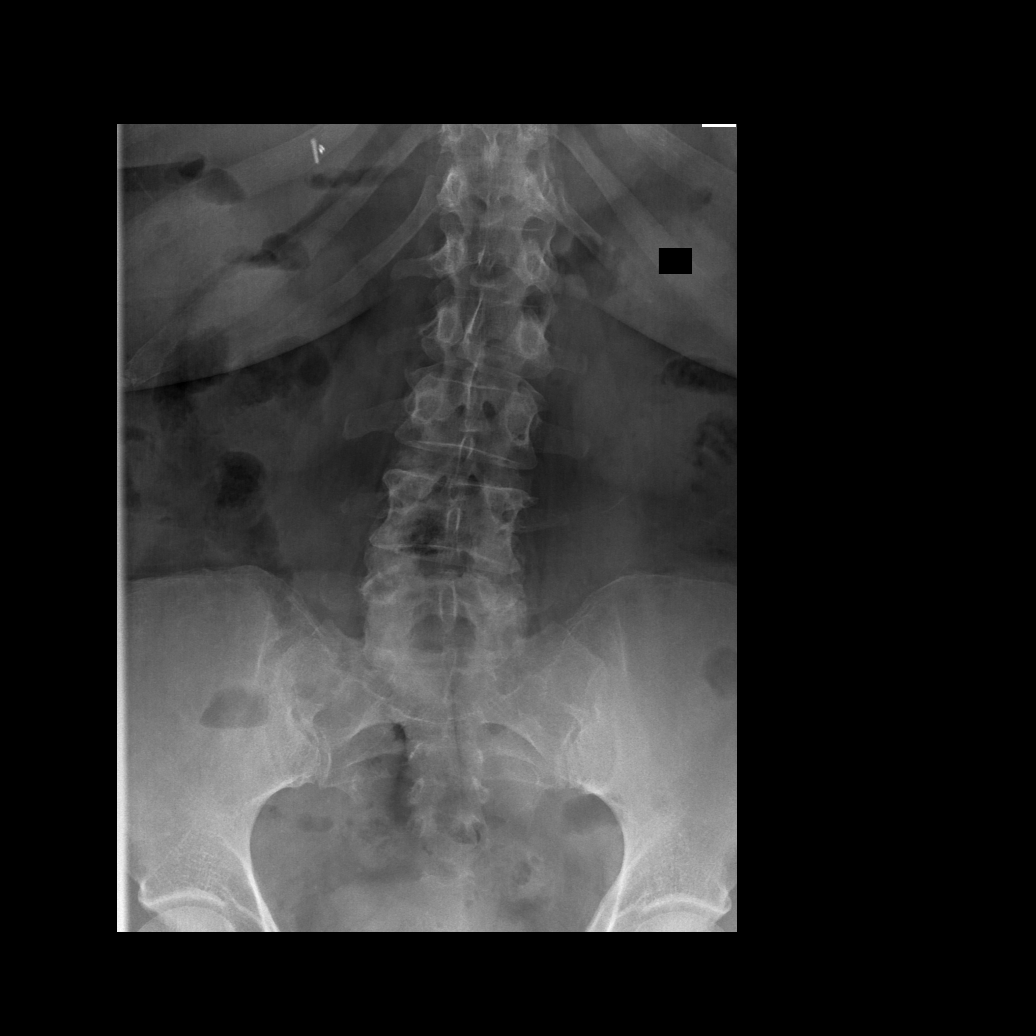

[4 of 4 positions shown; findings below may reference images not displayed]

FINDINGS: Lumbar vertebral heights are intact. There is mild focal lumbar 
levocurvature and lower lumbar dextrocurvature. There are surgical clips at the 
porta hepatis. 
There is no listhesis. There are lumbar facet degenerative changes, most 
pronounced L3-S1. Lumbar disc heights are maintained. 
Flexion-extension views show no subluxation .
IMPRESSION: Lumbar degenerative changes, appearing most pronounced in the lower facet 
joints.

## 2024-02-15 IMAGING — DX HIP BILATERAL WITH PELVIS 5 VIEWS
1 series · 5 of 5 positions shown · non-contrast
Comparison: None.

________________________________________________________________________________________________ 
HIP BILATERAL WITH PELVIS 5 VIEWS, SACROILIAC JOINT 3 VIEWS, 02/15/2024 [DATE]: 
CLINICAL INDICATION: Pain.

[Series 1: AP · 0.14mm/px · 5 of 5 slices shown]
[im 1/5]
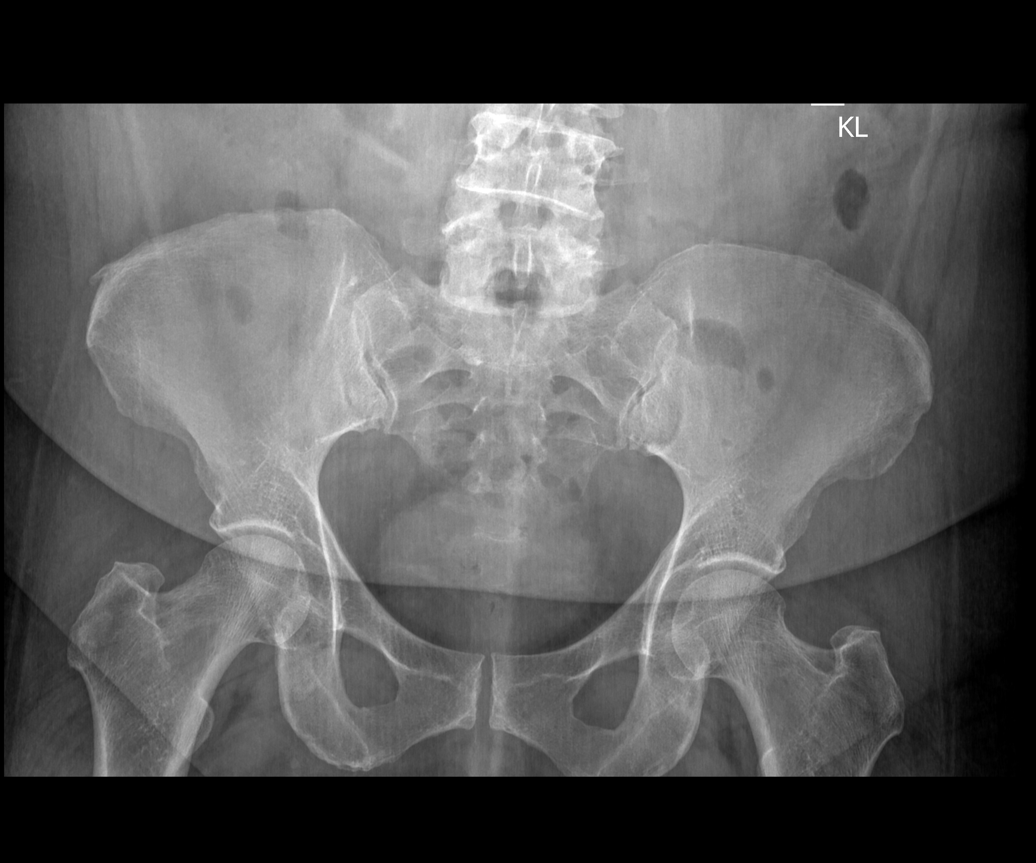
[im 2/5]
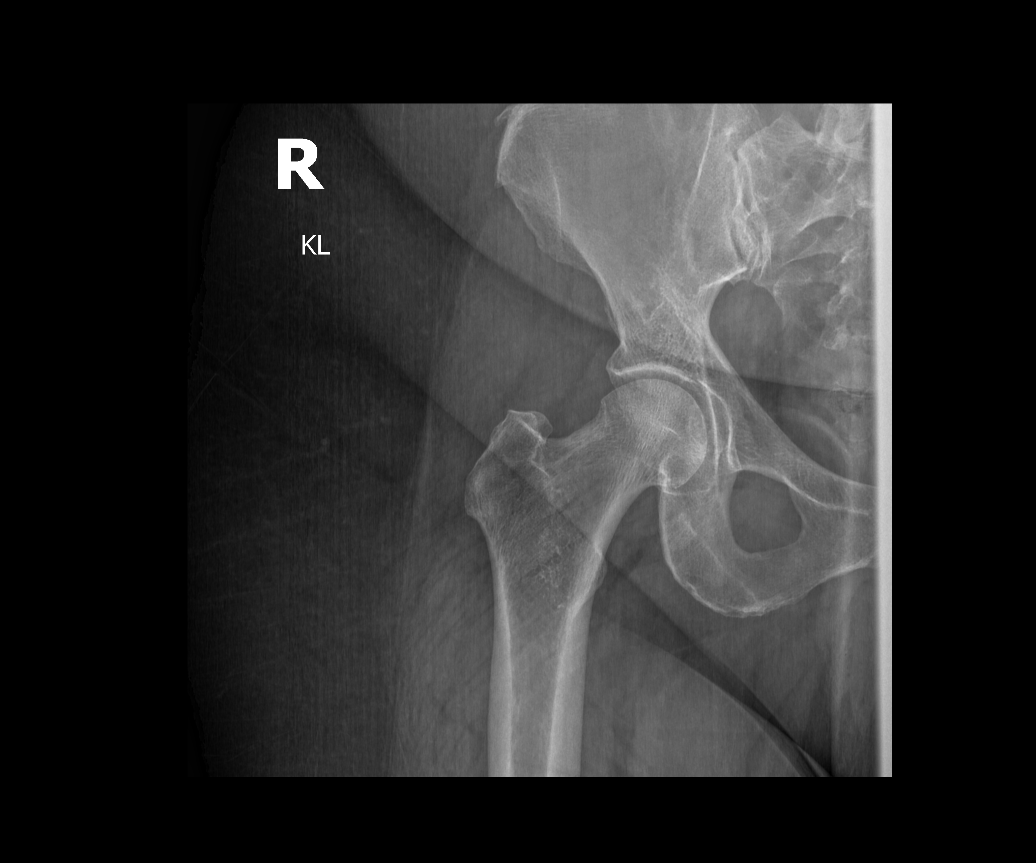
[im 3/5]
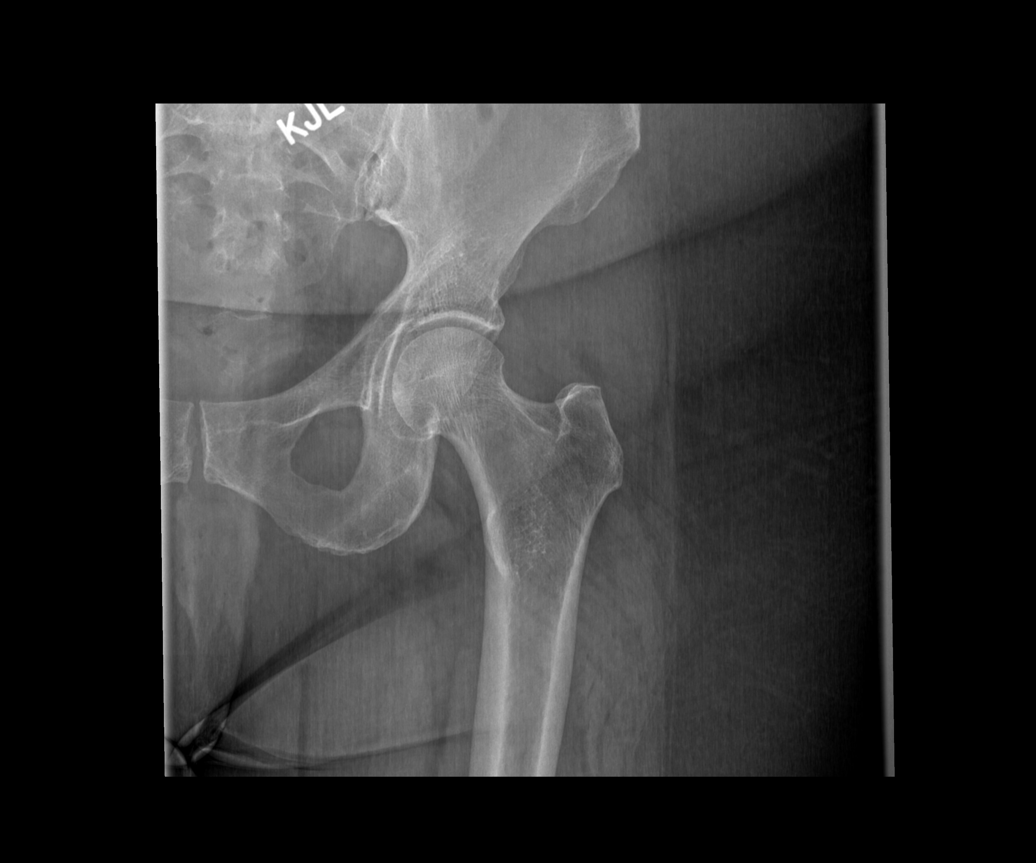
[im 4/5]
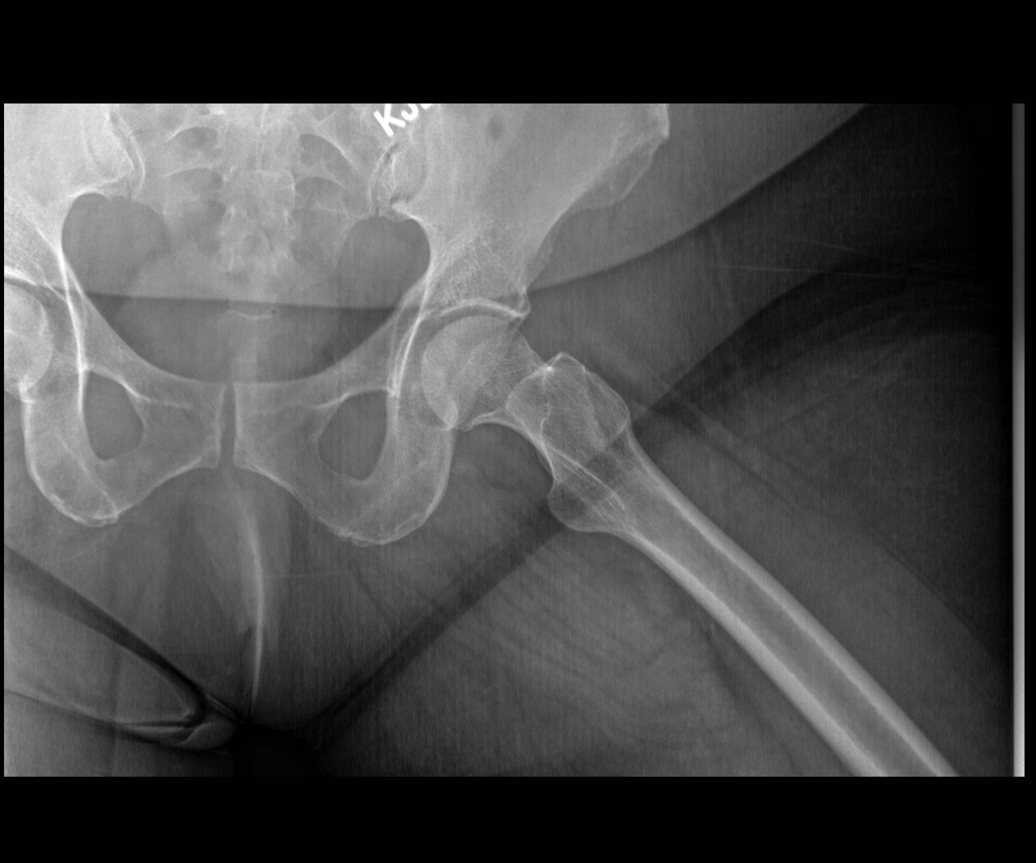
[im 5/5]
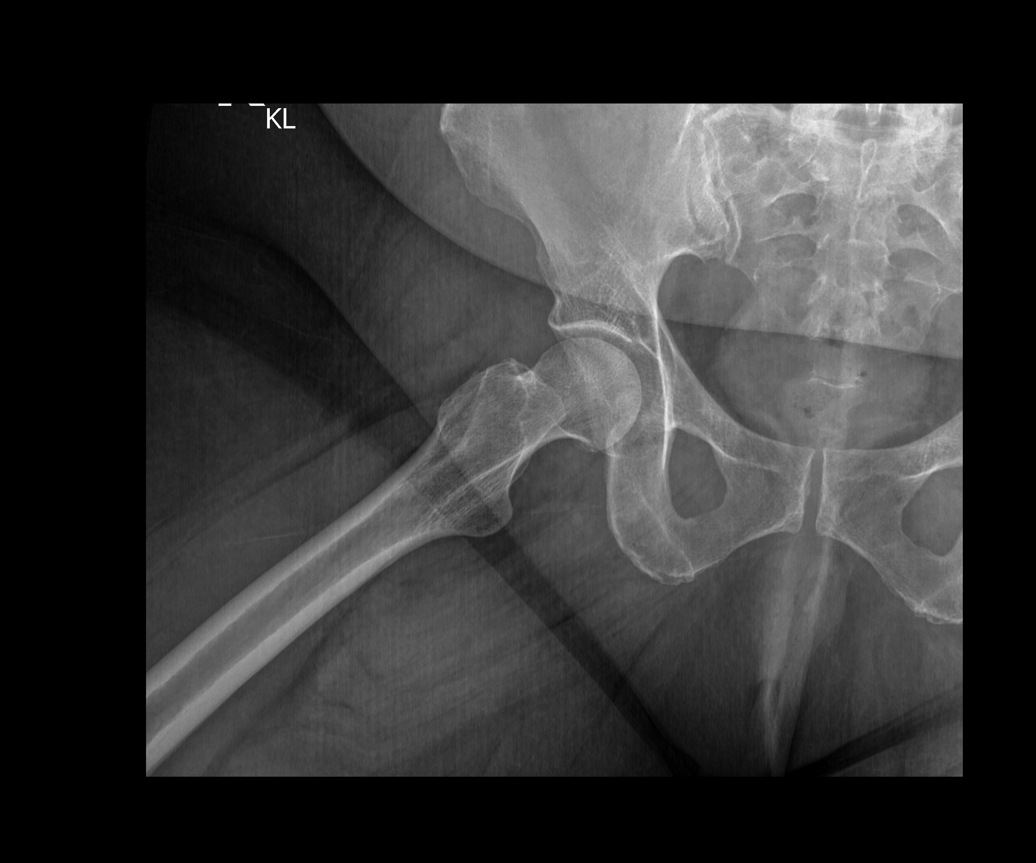

[5 of 5 positions shown; findings below may reference images not displayed]

FINDINGS: No fractures or dislocations. Hip joint spaces are preserved. Mild 
right iliac enthesopathy. Mild degenerative change of the SI joints and 
multilevel degenerative change of the spine. Normal bone density. No soft tissue 
swelling.
IMPRESSION: 1.  Hip joint spaces are preserved.  
2.  Mild degenerative change of the SI joints and multilevel degenerative change 
of the spine.

## 2024-02-15 IMAGING — MR MRI BRAIN W/WO CONTRAST
4 of 24 series · 6 of 48 positions shown · IV contrast (gadavist)
Comparison: Brain MRI October 28, 2021. Brain MRI June 03, 2019.

________________________________________________________________________________________________ 
MRI BRAIN W/WO CONTRAST, 02/15/2024 [DATE]: 
CLINICAL INDICATION: Neoplasm Of Unspecified Behavior Of Brain , follow-up exam 
for suspected subependymoma.
TECHNIQUE: Multiplanar, multiecho position MR images of the brain were performed 
without and with 9.5 mL of Gadavist were injected intravenously by hand. 0.5 mL 
of Gadavist discarded. Patient was scanned on a 1.5T magnet.

[mpr - smartbrain · axial · 1.1mm · 1.09mm/px · 1 of 2 slices shown]
[im 1/2]
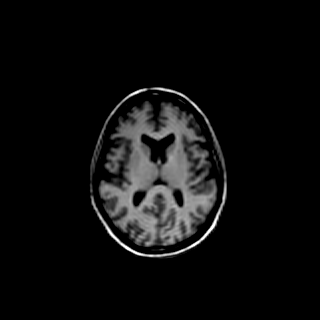

[T1 · axial · 5.0mm · 0.29mm/px · 1 of 27 slices shown (1 of 2)]
[im 1/27]
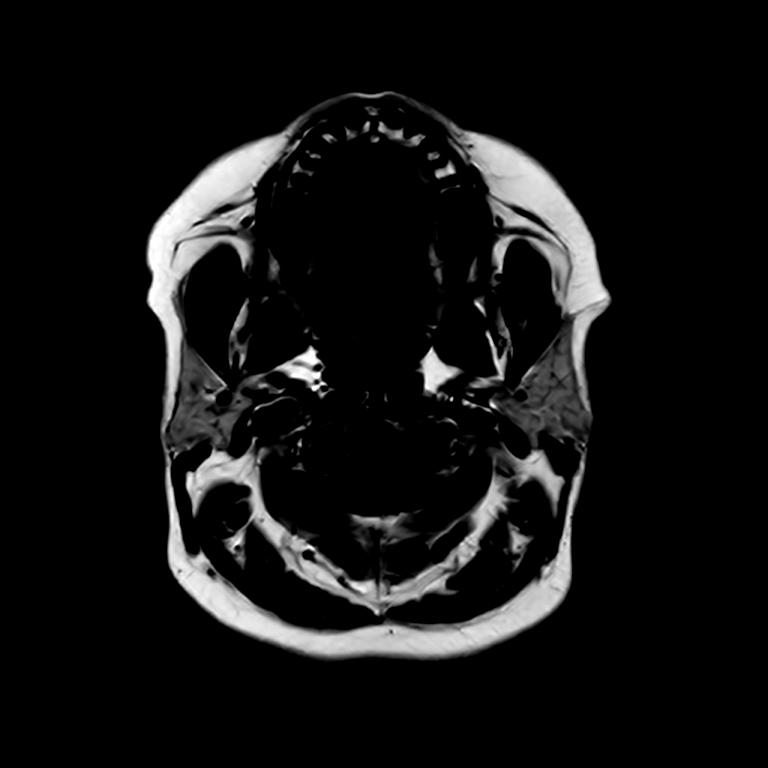

[T1 · coronal · 1.0mm · 0.24mm/px · 3 of 180 slices shown (2 of 2)]
[im 30/180]
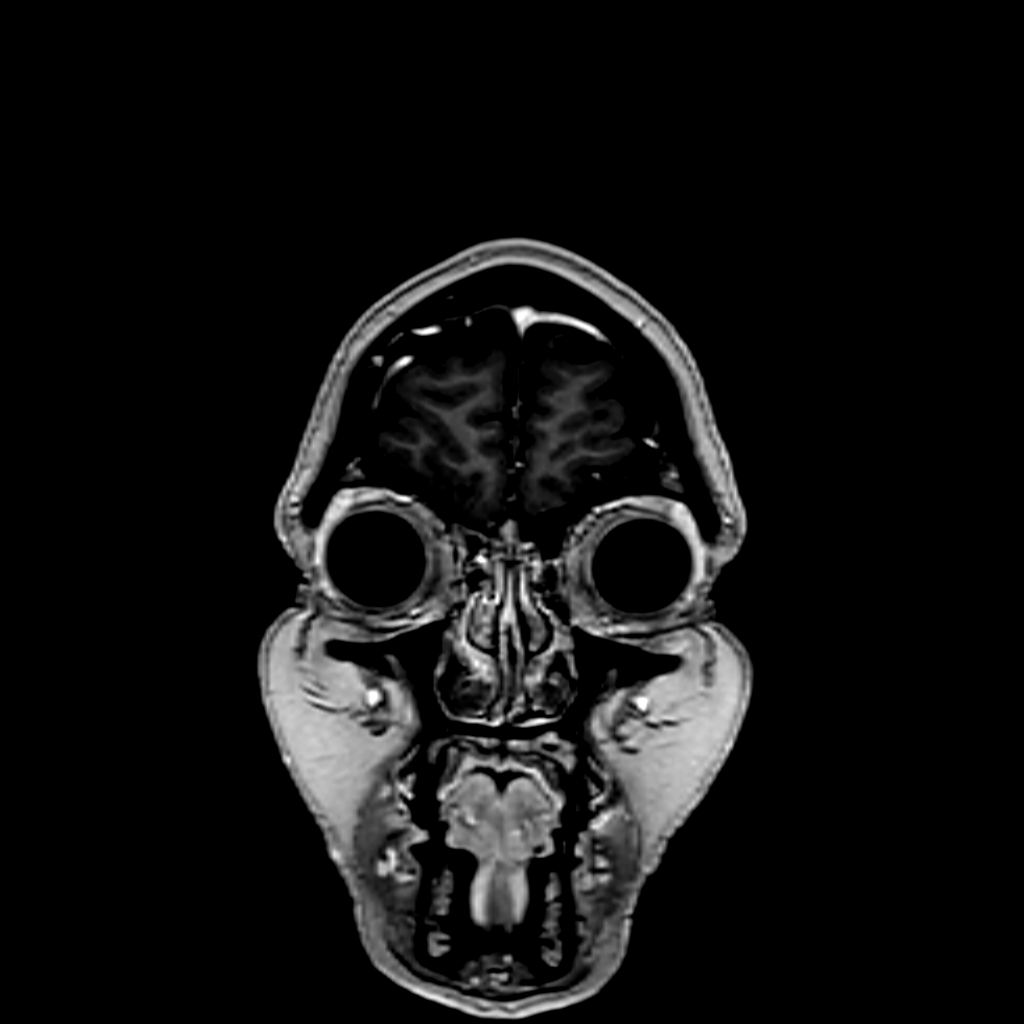
[im 90/180]
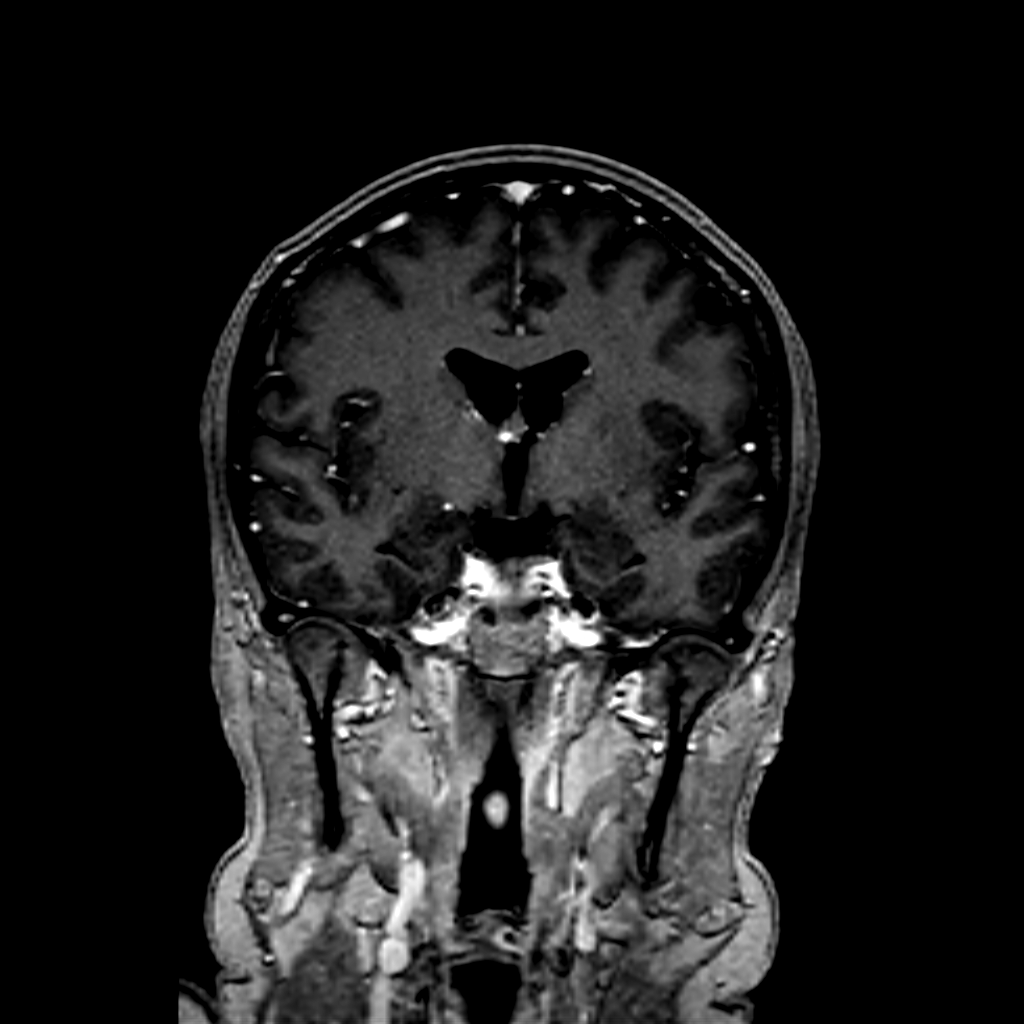
[im 150/180]
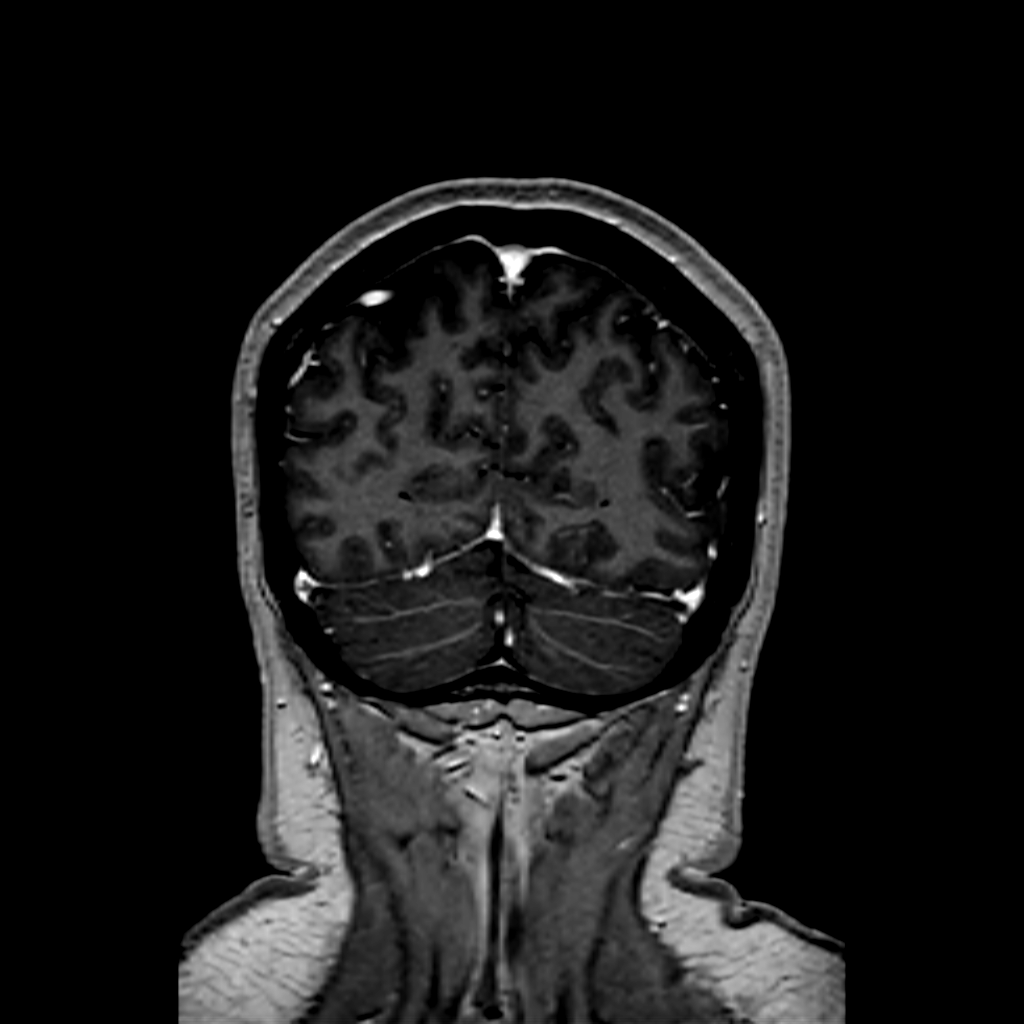

[T1 post-contrast · axial · 5.0mm · 0.29mm/px · 1 of 27 slices shown]
[im 1/27]
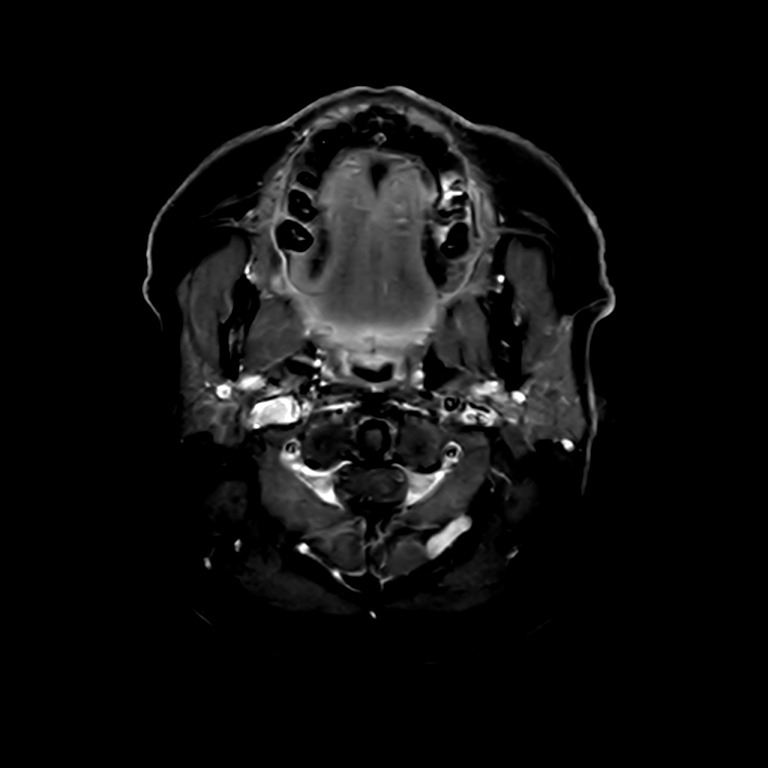

[6 of 48 positions shown; findings below may reference images not displayed]

FINDINGS: -------------------------------------------------------------------------------- 
------------------------- 
INTRACRANIAL: 
There is a stable appearing somewhat lobular, T1 isointense, FLAIR 
hyperintensities lesion along the ependymal surface of the body of the left 
lateral ventricle. There is some associated susceptibility just lateral to this 
lesion. The lesion itself and the susceptibility appears stable. The lesion 
demonstrates homogeneous enhancement. Differential would include a stable 
subependymoma versus intraventricular meningioma. The lesion measures 2.2 x
cm in axial dimension and 1.0 cm in craniocaudal dimension and fills portions of 
the left lateral ventricle. The oldest available study from June 03, 2019, the 
lesion measured 2.4 x 0.7 cm in axial dimension and 0.9 cm in craniocaudal 
dimension. Not significantly changed. No hydrocephalus. No adjacent brain 
parenchymal edema. No other brain parenchymal lesion. Otherwise there is normal 
brain parenchymal signal. No other mass. No abnormal extra-axial fluid 
collection. 
No acute ischemia. No abnormal foci of susceptibility artifact in the brain. 
Patency of the intracranial vascular flow voids.  No acute intracranial 
hemorrhage, mass effect, midline shift. No large sellar mass. No hydrocephalus. 
Cerebral volume is age appropriate.  No other pathologic contrast enhancement.  
-------------------------------------------------------------------------------- 
----------------------- 
OTHER: 
ORBITS/SINUSES/T-BONES:  Visualized orbits show no acute abnormality or mass.  
Mild scattered fluid within right mastoid air cells.  Visualized paranasal 
sinuses are clear. 
MARROW SIGNAL/SOFT TISSUES: No focal suspect signal abnormality. 
-------------------------------------------------------------------------------- 
-------------------
IMPRESSION: Stable enhancing lesion within the left lateral ventricle, subependymoma versus 
meningioma. Lesion demonstrates stability dating back to the oldest available 
study from June 03, 2019. No hydrocephalus. Continued follow-up recommended. 
Mild scattered fluid within several right mastoid air cells.
